# Patient Record
Sex: Male | Born: 1975 | Race: White | Hispanic: No | Marital: Married | Smoking: Never smoker
Health system: Southern US, Community
[De-identification: ages and names within clinical notes are randomized; demographics above are authoritative.]

## PROBLEM LIST (undated history)

## (undated) DIAGNOSIS — N200 Calculus of kidney: Secondary | ICD-10-CM

## (undated) DIAGNOSIS — E663 Overweight: Secondary | ICD-10-CM

## (undated) DIAGNOSIS — I10 Essential (primary) hypertension: Secondary | ICD-10-CM

## (undated) DIAGNOSIS — F419 Anxiety disorder, unspecified: Secondary | ICD-10-CM

## (undated) DIAGNOSIS — F411 Generalized anxiety disorder: Secondary | ICD-10-CM

## (undated) DIAGNOSIS — T7840XA Allergy, unspecified, initial encounter: Secondary | ICD-10-CM

## (undated) DIAGNOSIS — E785 Hyperlipidemia, unspecified: Secondary | ICD-10-CM

## (undated) DIAGNOSIS — M109 Gout, unspecified: Secondary | ICD-10-CM

## (undated) HISTORY — DX: Hyperlipidemia, unspecified: E78.5

## (undated) HISTORY — DX: Anxiety disorder, unspecified: F41.9

## (undated) HISTORY — PX: LITHOTRIPSY: SUR834

## (undated) HISTORY — DX: Overweight: E66.3

## (undated) HISTORY — DX: Generalized anxiety disorder: F41.1

## (undated) HISTORY — DX: Essential (primary) hypertension: I10

## (undated) HISTORY — PX: WRIST SURGERY: SHX841

## (undated) HISTORY — DX: Allergy, unspecified, initial encounter: T78.40XA

---

## 2001-09-19 ENCOUNTER — Emergency Department (HOSPITAL_COMMUNITY): Admission: EM | Admit: 2001-09-19 | Discharge: 2001-09-19 | Payer: Self-pay | Admitting: Emergency Medicine

## 2004-06-04 ENCOUNTER — Encounter: Admission: RE | Admit: 2004-06-04 | Discharge: 2004-06-04 | Payer: Self-pay | Admitting: Allergy and Immunology

## 2005-08-15 ENCOUNTER — Encounter: Admission: RE | Admit: 2005-08-15 | Discharge: 2005-08-15 | Payer: Self-pay | Admitting: Internal Medicine

## 2005-12-28 ENCOUNTER — Encounter: Admission: RE | Admit: 2005-12-28 | Discharge: 2005-12-28 | Payer: Self-pay | Admitting: Neurology

## 2006-01-27 ENCOUNTER — Ambulatory Visit (HOSPITAL_COMMUNITY): Admission: RE | Admit: 2006-01-27 | Discharge: 2006-01-27 | Payer: Self-pay | Admitting: Cardiology

## 2008-12-25 ENCOUNTER — Ambulatory Visit (HOSPITAL_COMMUNITY): Admission: RE | Admit: 2008-12-25 | Discharge: 2008-12-25 | Payer: Self-pay | Admitting: Urology

## 2009-02-25 ENCOUNTER — Ambulatory Visit: Payer: Self-pay | Admitting: Family Medicine

## 2009-02-25 DIAGNOSIS — J309 Allergic rhinitis, unspecified: Secondary | ICD-10-CM | POA: Insufficient documentation

## 2009-02-25 DIAGNOSIS — F411 Generalized anxiety disorder: Secondary | ICD-10-CM | POA: Insufficient documentation

## 2009-02-25 HISTORY — DX: Generalized anxiety disorder: F41.1

## 2009-02-25 LAB — CONVERTED CEMR LAB
ALT: 50 units/L (ref 0–53)
AST: 32 units/L (ref 0–37)
Albumin: 4.5 g/dL (ref 3.5–5.2)
Alkaline Phosphatase: 65 units/L (ref 39–117)
BUN: 12 mg/dL (ref 6–23)
Basophils Absolute: 0 10*3/uL (ref 0.0–0.1)
Basophils Relative: 0.3 % (ref 0.0–3.0)
Bilirubin, Direct: 0.2 mg/dL (ref 0.0–0.3)
CO2: 31 meq/L (ref 19–32)
Calcium: 9.6 mg/dL (ref 8.4–10.5)
Chloride: 108 meq/L (ref 96–112)
Cholesterol: 273 mg/dL — ABNORMAL HIGH (ref 0–200)
Creatinine, Ser: 1 mg/dL (ref 0.4–1.5)
Direct LDL: 190.2 mg/dL
Eosinophils Absolute: 0.2 10*3/uL (ref 0.0–0.7)
Eosinophils Relative: 3.6 % (ref 0.0–5.0)
GFR calc non Af Amer: 90.56 mL/min (ref >60)
Glucose, Bld: 84 mg/dL (ref 70–99)
HCT: 44.9 % (ref 39.0–52.0)
HDL: 30.5 mg/dL — ABNORMAL LOW (ref >39.00)
Hemoglobin: 15.9 g/dL (ref 13.0–17.0)
Lymphocytes Relative: 37.8 % (ref 12.0–46.0)
Lymphs Abs: 2.4 10*3/uL (ref 0.7–4.0)
MCHC: 35.4 g/dL (ref 30.0–36.0)
MCV: 88.1 fL (ref 78.0–100.0)
Monocytes Absolute: 0.4 10*3/uL (ref 0.1–1.0)
Monocytes Relative: 7 % (ref 3.0–12.0)
Neutro Abs: 3.4 10*3/uL (ref 1.4–7.7)
Neutrophils Relative %: 51.3 % (ref 43.0–77.0)
Platelets: 256 10*3/uL (ref 150.0–400.0)
Potassium: 3.6 meq/L (ref 3.5–5.1)
RBC: 5.1 M/uL (ref 4.22–5.81)
RDW: 12.2 % (ref 11.5–14.6)
Sodium: 143 meq/L (ref 135–145)
TSH: 1.12 microintl units/mL (ref 0.35–5.50)
Total Bilirubin: 1.8 mg/dL — ABNORMAL HIGH (ref 0.3–1.2)
Total CHOL/HDL Ratio: 9
Total Protein: 7.5 g/dL (ref 6.0–8.3)
Triglycerides: 295 mg/dL — ABNORMAL HIGH (ref 0.0–149.0)
VLDL: 59 mg/dL — ABNORMAL HIGH (ref 0.0–40.0)
WBC: 6.4 10*3/uL (ref 4.5–10.5)

## 2009-02-26 ENCOUNTER — Telehealth: Payer: Self-pay | Admitting: Family Medicine

## 2009-03-10 ENCOUNTER — Ambulatory Visit: Payer: Self-pay | Admitting: Family Medicine

## 2009-03-10 LAB — CONVERTED CEMR LAB
HDL: 33.8 mg/dL — ABNORMAL LOW (ref >39.00)
Triglycerides: 223 mg/dL — ABNORMAL HIGH (ref 0.0–149.0)
VLDL: 44.6 mg/dL — ABNORMAL HIGH (ref 0.0–40.0)

## 2009-03-20 ENCOUNTER — Ambulatory Visit: Payer: Self-pay | Admitting: Family Medicine

## 2009-03-20 DIAGNOSIS — E785 Hyperlipidemia, unspecified: Secondary | ICD-10-CM

## 2009-03-20 HISTORY — DX: Hyperlipidemia, unspecified: E78.5

## 2009-05-20 ENCOUNTER — Ambulatory Visit: Payer: Self-pay | Admitting: Family Medicine

## 2009-05-20 LAB — CONVERTED CEMR LAB
ALT: 70 units/L — ABNORMAL HIGH (ref 0–53)
AST: 43 units/L — ABNORMAL HIGH (ref 0–37)
Albumin: 4.4 g/dL (ref 3.5–5.2)
Alkaline Phosphatase: 89 units/L (ref 39–117)
Bilirubin, Direct: 0.2 mg/dL (ref 0.0–0.3)
Cholesterol: 171 mg/dL (ref 0–200)
Direct LDL: 104.6 mg/dL
HDL: 35 mg/dL — ABNORMAL LOW (ref >39.00)
Total Bilirubin: 1.8 mg/dL — ABNORMAL HIGH (ref 0.3–1.2)
Total CHOL/HDL Ratio: 5
Total Protein: 7.4 g/dL (ref 6.0–8.3)
Triglycerides: 232 mg/dL — ABNORMAL HIGH (ref 0.0–149.0)
VLDL: 46.4 mg/dL — ABNORMAL HIGH (ref 0.0–40.0)

## 2009-06-02 ENCOUNTER — Ambulatory Visit: Payer: Self-pay | Admitting: Family Medicine

## 2009-07-15 ENCOUNTER — Ambulatory Visit: Payer: Self-pay | Admitting: Family Medicine

## 2009-07-15 DIAGNOSIS — R109 Unspecified abdominal pain: Secondary | ICD-10-CM | POA: Insufficient documentation

## 2009-07-21 ENCOUNTER — Telehealth: Payer: Self-pay | Admitting: Family Medicine

## 2009-07-23 ENCOUNTER — Ambulatory Visit (HOSPITAL_BASED_OUTPATIENT_CLINIC_OR_DEPARTMENT_OTHER): Admission: RE | Admit: 2009-07-23 | Discharge: 2009-07-23 | Payer: Self-pay | Admitting: Orthopedic Surgery

## 2009-10-28 ENCOUNTER — Telehealth: Payer: Self-pay | Admitting: Family Medicine

## 2010-06-02 ENCOUNTER — Ambulatory Visit: Payer: Self-pay | Admitting: Family Medicine

## 2010-06-02 LAB — CONVERTED CEMR LAB
ALT: 69 units/L — ABNORMAL HIGH (ref 0–53)
AST: 43 units/L — ABNORMAL HIGH (ref 0–37)
Albumin: 4.6 g/dL (ref 3.5–5.2)
Alkaline Phosphatase: 84 units/L (ref 39–117)
BUN: 14 mg/dL (ref 6–23)
Basophils Absolute: 0.1 10*3/uL (ref 0.0–0.1)
Basophils Relative: 0.8 % (ref 0.0–3.0)
Bilirubin Urine: NEGATIVE
Bilirubin, Direct: 0.3 mg/dL (ref 0.0–0.3)
CO2: 29 meq/L (ref 19–32)
Calcium: 9.5 mg/dL (ref 8.4–10.5)
Chloride: 105 meq/L (ref 96–112)
Cholesterol: 157 mg/dL (ref 0–200)
Creatinine, Ser: 0.9 mg/dL (ref 0.4–1.5)
Eosinophils Absolute: 0.3 10*3/uL (ref 0.0–0.7)
Eosinophils Relative: 4.5 % (ref 0.0–5.0)
GFR calc non Af Amer: 98.98 mL/min (ref >60)
Glucose, Bld: 95 mg/dL (ref 70–99)
HCT: 44.2 % (ref 39.0–52.0)
HDL: 34.4 mg/dL — ABNORMAL LOW (ref >39.00)
Hemoglobin, Urine: NEGATIVE
Hemoglobin: 15.3 g/dL (ref 13.0–17.0)
Ketones, ur: NEGATIVE mg/dL
LDL Cholesterol: 86 mg/dL (ref 0–99)
Leukocytes, UA: NEGATIVE
Lymphocytes Relative: 43.1 % (ref 12.0–46.0)
Lymphs Abs: 3.1 10*3/uL (ref 0.7–4.0)
MCHC: 34.6 g/dL (ref 30.0–36.0)
MCV: 89.4 fL (ref 78.0–100.0)
Monocytes Absolute: 0.5 10*3/uL (ref 0.1–1.0)
Monocytes Relative: 7.3 % (ref 3.0–12.0)
Neutro Abs: 3.2 10*3/uL (ref 1.4–7.7)
Neutrophils Relative %: 44.3 % (ref 43.0–77.0)
Nitrite: NEGATIVE
Platelets: 254 10*3/uL (ref 150.0–400.0)
Potassium: 4.7 meq/L (ref 3.5–5.1)
RBC: 4.94 M/uL (ref 4.22–5.81)
RDW: 12.4 % (ref 11.5–14.6)
Sodium: 142 meq/L (ref 135–145)
Specific Gravity, Urine: 1.025 (ref 1.000–1.030)
TSH: 0.95 microintl units/mL (ref 0.35–5.50)
Total Bilirubin: 1.8 mg/dL — ABNORMAL HIGH (ref 0.3–1.2)
Total CHOL/HDL Ratio: 5
Total Protein, Urine: NEGATIVE mg/dL
Total Protein: 7.3 g/dL (ref 6.0–8.3)
Triglycerides: 183 mg/dL — ABNORMAL HIGH (ref 0.0–149.0)
Urine Glucose: NEGATIVE mg/dL
Urobilinogen, UA: 0.2 (ref 0.0–1.0)
VLDL: 36.6 mg/dL (ref 0.0–40.0)
WBC: 7.1 10*3/uL (ref 4.5–10.5)
pH: 6 (ref 5.0–8.0)

## 2010-06-09 ENCOUNTER — Ambulatory Visit: Payer: Self-pay | Admitting: Family Medicine

## 2010-09-09 ENCOUNTER — Ambulatory Visit: Payer: Self-pay | Admitting: Family Medicine

## 2010-09-09 LAB — CONVERTED CEMR LAB
ALT: 80 units/L — ABNORMAL HIGH (ref 0–53)
AST: 38 units/L — ABNORMAL HIGH (ref 0–37)
Albumin: 4.3 g/dL (ref 3.5–5.2)
Alkaline Phosphatase: 89 units/L (ref 39–117)
Bilirubin, Direct: 0.2 mg/dL (ref 0.0–0.3)
Cholesterol: 245 mg/dL — ABNORMAL HIGH (ref 0–200)
Direct LDL: 127.9 mg/dL
HDL: 40.4 mg/dL (ref >39.00)
Total Bilirubin: 1.6 mg/dL — ABNORMAL HIGH (ref 0.3–1.2)
Total CHOL/HDL Ratio: 6
Total Protein: 7.2 g/dL (ref 6.0–8.3)
Triglycerides: 371 mg/dL — ABNORMAL HIGH (ref 0.0–149.0)
VLDL: 74.2 mg/dL — ABNORMAL HIGH (ref 0.0–40.0)

## 2010-10-13 ENCOUNTER — Telehealth: Payer: Self-pay | Admitting: Family Medicine

## 2010-10-31 ENCOUNTER — Encounter: Payer: Self-pay | Admitting: Neurology
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
## 2010-11-09 NOTE — Progress Notes (Signed)
Summary: celexa refill  Phone Note Call from Patient   Summary of Call: patient would like to increase his celexa to 40mg  and will need a refill Initial call taken by: Kern Reap CMA Duncan Dull),  October 28, 2009 11:45 AM    New/Updated Medications: CELEXA 40 MG TABS (CITALOPRAM HYDROBROMIDE) take one tab at bedtime Prescriptions: CELEXA 40 MG TABS (CITALOPRAM HYDROBROMIDE) take one tab at bedtime  #100 x 3   Entered by:   Kern Reap CMA (AAMA)   Authorized by:   Roderick Pee MD   Signed by:   Kern Reap CMA (AAMA) on 10/28/2009   Method used:   Electronically to        [facility] [address] [phone]* (retail)       [address]       [place], [place]  [address]       Ph: [phone] or [phone]       Fax: [phone]   RxID:   [id number]

## 2010-11-11 NOTE — Progress Notes (Signed)
  Phone Note Call from Patient   Caller: Spouse Call For: Roderick Pee MD Summary of Call: Wife is  asking for a recommendation for a neurosurgeon.  His MRI showed ruputure disc, and spurs. [phone] Pt.  [phone] Initial call taken by: Lynann Beaver CMA AAMA,  October 13, 2010 9:07 AM  Follow-up for Phone Call        Dr. Barnett Abu, and his group Follow-up by: Roderick Pee MD,  October 14, 2010 7:51 AM  Additional Follow-up for Phone Call Additional follow up Details #1::        Notified pt. Additional Follow-up by: Lynann Beaver CMA AAMA,  October 14, 2010 11:15 AM

## 2010-11-22 ENCOUNTER — Ambulatory Visit (INDEPENDENT_AMBULATORY_CARE_PROVIDER_SITE_OTHER): Payer: Managed Care, Other (non HMO) | Admitting: Family Medicine

## 2010-11-22 ENCOUNTER — Encounter: Payer: Self-pay | Admitting: Family Medicine

## 2010-11-22 VITALS — BP 130/90 | Temp 98.3°F | Ht 69.5 in | Wt 233.0 lb

## 2010-11-22 DIAGNOSIS — J069 Acute upper respiratory infection, unspecified: Secondary | ICD-10-CM

## 2010-11-22 DIAGNOSIS — F411 Generalized anxiety disorder: Secondary | ICD-10-CM

## 2010-11-22 MED ORDER — CITALOPRAM HYDROBROMIDE 40 MG PO TABS: ORAL_TABLET | ORAL | Status: DC

## 2010-11-22 MED ORDER — HYDROCODONE-HOMATROPINE 5-1.5 MG/5ML PO SYRP: 5.0000 mL | ORAL_SOLUTION | Freq: Four times a day (QID) | ORAL | Status: AC | PRN

## 2010-11-22 MED ORDER — PREDNISONE 20 MG PO TABS: ORAL_TABLET | ORAL | Status: DC

## 2010-11-22 NOTE — Patient Instructions (Signed)
Drink lots of water.  Hydromet one half to 1 teaspoon at bedtime p.r.n. Cough.  Prednisone two tabs x 3 days, one x 3 days, a half x 3 days, then stop.  Return p.r.n.

## 2010-11-22 NOTE — Progress Notes (Signed)
  Subjective:    Patient ID: William Li, male    DOB: [birth year], 35 y.o.   MRN: [id number]  HPI CTim is a 35 year old, married male, nonsmoker, who comes in with a 3-day history of head congestion, sore throat, and cough.  No fever. He also states that he is taking 60 mg of Celexa daily, however, he still having issues with the panic attack and [place].  We discussed risks, options, the first thing will increase the Celexa to 80 if that does not work then I would recommend he see Liane Comber.  He  Review of Systems    He does have allergic rhinitis, but no history of any lung disease.  Again, a nonsmoker. Objective:   Physical Exam    Well-developed well-nourished, male in no acute distress.  Examination of the HEENT was negative.  Neck was supple.  No adenopathy.  Lungs are clear except for some very faint late expiratory wheezing    Assessment & Plan:  Viral syndrome with some mild secondary asthma.  Prednisone burst and taper.  Hydromet p.r.n.  Return p.r.n.

## 2010-11-26 ENCOUNTER — Telehealth: Payer: Self-pay | Admitting: Family Medicine

## 2010-11-26 NOTE — Telephone Encounter (Signed)
Triage vm-----pt stated that he saw Dr Tawanna Cooler on Monday and was given a cough medicine. He had to double up on it and now he is out. His cough has worsened and he is requesting a cough syrup that is stronger to help him sleep through the night. His pharmacy is [facility] on [address].

## 2010-11-26 NOTE — Telephone Encounter (Signed)
Advised to come in today for further evaluation.  However, he is out of town.  The patient he may want to try this Saturday clinic

## 2010-11-26 NOTE — Telephone Encounter (Signed)
An office visit was offered.  patient  Is out of town and can not come in for an office visit.  He states that the cough is not allowing him to sleep at night because the cough syrup is not working.  Any suggestions?

## 2010-11-30 ENCOUNTER — Ambulatory Visit (INDEPENDENT_AMBULATORY_CARE_PROVIDER_SITE_OTHER): Payer: Managed Care, Other (non HMO) | Admitting: Family Medicine

## 2010-11-30 ENCOUNTER — Encounter: Payer: Self-pay | Admitting: Family Medicine

## 2010-11-30 VITALS — BP 120/90 | Temp 98.1°F | Wt 230.0 lb

## 2010-11-30 DIAGNOSIS — J45901 Unspecified asthma with (acute) exacerbation: Secondary | ICD-10-CM

## 2010-11-30 MED ORDER — DOXYCYCLINE HYCLATE 100 MG PO TABS: 100.0000 mg | ORAL_TABLET | Freq: Two times a day (BID) | ORAL | Status: AC

## 2010-11-30 NOTE — Progress Notes (Signed)
  Subjective:    Patient ID: William Li, male    DOB: 03-01-1976, 35 y.o.   MRN: [id number]  HPI  William Li is a 35 year old, married male, nonsmoker, who comes in today for evaluation of head congestion, postnasal drip, and cough x 10 days.  We saw him a week ago with similar symptoms.  Examination at that time was negative except for some mild expiratory wheezing.  He was given a short course of prednisone, which he finished however, he states he still does not feel well.  Review of systems otherwise negative  Review of Systems neg    Objective:   Physical Exam    Well-developed well-nourished, male in no acute distress.  Examination HEENT negative.  Neck was supple.  Thyroid not enlarged.  No adenopathy.  Lungs are clear except for some very faint late expiratory wheezing    Assessment & Plan:  Viral syndrome with mild asthma.  Plan drink lots of water, prednisone, 20 mg a day for 3 days, 10 mg a day for 3 days, then 10 mg Monday, Wednesday, Friday, for a two-week taper.  Doxycycline 100 mg b.i.d. X 2 weeks.  Hydromet p.r.n. For cough

## 2010-11-30 NOTE — Patient Instructions (Signed)
Drink lots of water.  Prednisone one tablet x 3 days, a half x 3 days, then half a tablet every other day for a two-week taper.  Hydromet one half to 1 teaspoon at bedtime as needed for nighttime cough.  Doxycycline 100 mg b.i.d.

## 2010-12-09 ENCOUNTER — Telehealth: Payer: Self-pay | Admitting: *Deleted

## 2010-12-09 NOTE — Telephone Encounter (Signed)
If the medication, that we used has not helped that I would recommended that he see Dr. Nolen Mu

## 2010-12-09 NOTE — Telephone Encounter (Signed)
patient  Would like to try a different medication before seeing a new doctor.  Any suggestions?

## 2010-12-09 NOTE — Telephone Encounter (Signed)
Fleet Contras please call,,,,,, the milligrams a day, Celexa is the maximum dose of this medication, if it's not working.  I would recommend we get a consult from Dr. Rolly Pancake, Nolen Mu.  He can call and make his own appointment, but it will take 4 to 6 weeks because she is so good.  Therefore, in the meantime try Zoloft 50  mg nightly.........Marland Kitchenplease call and 50 tabs, one refill

## 2010-12-09 NOTE — Telephone Encounter (Signed)
Pt has increased up to the maximum dose of Celexa, and it is not working.   Would like another RX.

## 2010-12-10 NOTE — Telephone Encounter (Signed)
Left message on machine for patient

## 2011-02-25 NOTE — Op Note (Signed)
NAMEBRADDOCK, SERVELLON              ACCOUNT NO.:  [id number]   MEDICAL RECORD NO.:  [id number]          PATIENT TYPE:  OIB   LOCATION:  2899                         FACILITY:  [facility]   PHYSICIAN:  Armanda Magic, M.D.     DATE OF BIRTH:  1976-03-18   DATE OF PROCEDURE:  01/27/2006  DATE OF DISCHARGE:  01/27/2006                                 OPERATIVE REPORT   PROCEDURE PERFORMED:  Tilt table test.   REFERRING PHYSICIAN:  Meade Maw, M.D.   OPERATOR:  Armanda Magic, M.D.   INDICATIONS FOR PROCEDURE:  Syncope and presyncope.   COMPLICATIONS:  None.   The patient is a very pleasant 35 year old white male who has been having  some problems with syncope in the past which sounds vasovagal.  He would get  syncope when giving blood or watching any kind of invasive procedure.  Apparently he passed out when his wife had an amniocentesis as well as when  she had an epidural placed.  Recently, he has been having a lot of problems  with dizzy spells and now presents for tilt table test.   DESCRIPTION OF PROCEDURE:  The patient was brought to the cardiac  catheterization laboratory in a fasting nonsedated state.  Informed consent  was obtained.  The patient was connected to continuous heart rate and pulse  oximetry monitoring and intermittent blood pressure monitoring.  The  patient's blood pressure was measured supine for five minutes.  Then the  patient was tilted upright to 70 degrees for a total of 30 minutes.  The  patient was then placed supine and started on isuprel drip at 7.5 mL per  hour.  The drip was titrated up to a maximum of 15 mL per hour to obtain a  baseline heart rate increase of 20%.  The patient was then tilted upright to  70 degrees for a total of 13 minutes.  At the end of the procedure, the  patient was tilted back to supine position and later discharged to home.   Baseline blood pressure was 112/88 to 120/67 with heart rates in the 70s.  Lowest blood pressure  achieved during upright tilt as 113/74, heart rates  were in the 80s.  Lowest blood pressure achieved during upright tilt on  isuprel was 112/70 with heart rates in the 90s to low 100s.   ASSESSMENT:  1.  History of syncope and dizziness.  2.  Negative tilt table test for syncope or presyncope.  Normal blood      pressure and heart rate response to upright tilt and isuprel infusion.   PLAN:  Discharged to home and follow up with Dr. Fraser Din.      Armanda Magic, M.D.  Electronically Signed     TT/MEDQ  D:  01/27/2006  T:  01/30/2006  Job:  [id number]   cc:   Meade Maw, M.D.  Fax: [phone]

## 2011-06-09 ENCOUNTER — Ambulatory Visit (INDEPENDENT_AMBULATORY_CARE_PROVIDER_SITE_OTHER): Payer: Managed Care, Other (non HMO) | Admitting: Family Medicine

## 2011-06-09 ENCOUNTER — Encounter: Payer: Self-pay | Admitting: Family Medicine

## 2011-06-09 DIAGNOSIS — M999 Biomechanical lesion, unspecified: Secondary | ICD-10-CM

## 2011-06-09 DIAGNOSIS — E663 Overweight: Secondary | ICD-10-CM

## 2011-06-09 DIAGNOSIS — R030 Elevated blood-pressure reading, without diagnosis of hypertension: Secondary | ICD-10-CM

## 2011-06-09 DIAGNOSIS — M9981 Other biomechanical lesions of cervical region: Secondary | ICD-10-CM

## 2011-06-09 DIAGNOSIS — I10 Essential (primary) hypertension: Secondary | ICD-10-CM | POA: Insufficient documentation

## 2011-06-09 DIAGNOSIS — IMO0001 Reserved for inherently not codable concepts without codable children: Secondary | ICD-10-CM

## 2011-06-09 DIAGNOSIS — M542 Cervicalgia: Secondary | ICD-10-CM

## 2011-06-09 HISTORY — DX: Overweight: E66.3

## 2011-06-09 MED ORDER — VENLAFAXINE HCL ER 37.5 MG PO CP24: 75.0000 mg | ORAL_CAPSULE | Freq: Every day | ORAL | Status: DC

## 2011-06-09 NOTE — Assessment & Plan Note (Signed)
Elevated today, will be back in 3 weeks, if still elevated then would consider starting a medicine, no HCTZ due to pt stating ? Gout flare.   Will get labs at that time as well.

## 2011-06-09 NOTE — Patient Instructions (Addendum)
Nice to meet you We will try a new medicine called effexor.  Take 1 pill daily for the next week then 2 pills daily thereafter.  For you neck, try not to sleep with your arm up and do the exercises we talked about No more regular soda! I want to see you again in 3 weeks for your neck and to recheck your blood pressure.

## 2011-06-09 NOTE — Assessment & Plan Note (Signed)
Discussed food choices working out and attainable goals, pt will come back in three week goal of 230.

## 2011-06-09 NOTE — Assessment & Plan Note (Signed)
After verbal consent pt did have HVLA, with marked improvement.  Gave side effects to look out for and can take anti inflammatories in the acute time frame.  Given exercises and stretches will see again in 3 weeks.

## 2011-06-09 NOTE — Progress Notes (Signed)
  Subjective:    Patient ID: William Li, male    DOB: January 31, 1976, 35 y.o.   MRN: [id number]  HPI New pt here to establish care. Pt states was told about me and my manipulation for his neck 1.  Neck pain-  Pt has had it for years, used to lift a lot of weight, has not as much since he had his second daughter who is now 7 years of age.  States that he had a work up for it in the past because it was due to him having dizziness when he turns his head. Pt MRI was normal, still has trouble turning head and now does not drive because he becomes anxious. Pt states it hurts on both sides, no radiation of pain, no more of the dizziness, no trouble swallowing.  Denies weakness or tingling in extremities.  Pt x-ray does show some bone spurs on right C5-6 2.  Cholesterol-  Has been elevated in the past family hx of heart problems. Pt had it checked 9 months ago Lab Results  Component Value Date   CHOL 245* 09/09/2010   CHOL 157 06/02/2010   CHOL 171 05/20/2009   Lab Results  Component Value Date   HDL 40.40 09/09/2010   HDL 44.01* 06/02/2010   HDL 35.00* 05/20/2009   Lab Results  Component Value Date   LDLCALC 86 06/02/2010   Lab Results  Component Value Date   TRIG 371.0* 09/09/2010   TRIG 183.0* 06/02/2010   TRIG 232.0* 05/20/2009   Lab Results  Component Value Date   CHOLHDL 6 09/09/2010   CHOLHDL 5 06/02/2010   CHOLHDL 5 05/20/2009   Lab Results  Component Value Date   LDLDIRECT 127.9 09/09/2010   LDLDIRECT 104.6 05/20/2009   LDLDIRECT 144.6 03/10/2009   Only taking crestor three times a day due to elevated LFT's  3.  Obesity-  Pt has gained a lot of weight from improper diet and from not working out.   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 Pt states he is going to stop regular soda.  Review of Systems Negative except stated in HPI    Objective:   Physical Exam  Constitutional: He is oriented to person, place, and time. He appears  well-developed.       overweight white male  HENT:  Head: Normocephalic.  Right Ear: External ear normal.  Left Ear: External ear normal.  Eyes: Conjunctivae and EOM are normal. Pupils are equal, round, and reactive to light.  Neck: Normal range of motion. Thyromegaly present.  Cardiovascular: Normal rate, regular rhythm and normal heart sounds.   No murmur heard. Pulmonary/Chest: Effort normal and breath sounds normal.  Abdominal: Soft. Bowel sounds are normal. He exhibits no distension. There is no tenderness.  Musculoskeletal: He exhibits no edema.  Neurological: He is alert and oriented to person, place, and time. He has normal reflexes.  Skin: Skin is warm.  OMT Findings: Cervical: C3 rotated and side bent left  C6 rotated and side bent right   Thoracic: T5 rotated and side bent right Lumbar:neutral  Sacrum:neutral         Assessment & Plan:

## 2011-06-09 NOTE — Assessment & Plan Note (Signed)
After verbal consent pt did have HVLA, with marked improvement.  Gave side effects to look out for and can take anti inflammatories in the acute time frame.   

## 2011-06-14 ENCOUNTER — Other Ambulatory Visit: Payer: Self-pay | Admitting: Family Medicine

## 2011-06-21 ENCOUNTER — Ambulatory Visit (INDEPENDENT_AMBULATORY_CARE_PROVIDER_SITE_OTHER): Payer: Managed Care, Other (non HMO) | Admitting: Family Medicine

## 2011-06-21 ENCOUNTER — Encounter: Payer: Self-pay | Admitting: Family Medicine

## 2011-06-21 DIAGNOSIS — M542 Cervicalgia: Secondary | ICD-10-CM

## 2011-06-21 DIAGNOSIS — M67919 Unspecified disorder of synovium and tendon, unspecified shoulder: Secondary | ICD-10-CM

## 2011-06-21 DIAGNOSIS — E785 Hyperlipidemia, unspecified: Secondary | ICD-10-CM

## 2011-06-21 DIAGNOSIS — M999 Biomechanical lesion, unspecified: Secondary | ICD-10-CM

## 2011-06-21 DIAGNOSIS — R030 Elevated blood-pressure reading, without diagnosis of hypertension: Secondary | ICD-10-CM

## 2011-06-21 DIAGNOSIS — M7551 Bursitis of right shoulder: Secondary | ICD-10-CM

## 2011-06-21 DIAGNOSIS — M9981 Other biomechanical lesions of cervical region: Secondary | ICD-10-CM

## 2011-06-21 MED ORDER — DIAZEPAM 2 MG PO TABS: 2.0000 mg | ORAL_TABLET | Freq: Two times a day (BID) | ORAL | Status: AC | PRN

## 2011-06-21 NOTE — Assessment & Plan Note (Signed)
Patient has history of hypertriglyceridemia, and high cholesterol only taking Crestor 3 times a week due to the high liver enzymes. We will get labs as necessary.

## 2011-06-21 NOTE — Assessment & Plan Note (Signed)
After verbal consent pt did have HVLA, with marked improvement.  Gave side effects to look out for and can take anti inflammatories in the acute time frame.

## 2011-06-21 NOTE — Patient Instructions (Addendum)
Good to see you again. I have ordered labs on you. I would like you to come back in the morning when you are fasting to have been drawn.  I will tell you at next appointment I am giving you allium to try as a muscle relaxant. See you on the 20th .

## 2011-06-21 NOTE — Assessment & Plan Note (Signed)
After verbal consent pt did have HVLA, with marked improvement.  Gave side effects to look out for and can take anti inflammatories in the acute time frame.   

## 2011-06-21 NOTE — Progress Notes (Signed)
Subjective:    Patient ID: William Li, male    DOB: December 26, 1975, 35 y.o.   MRN: [id number]  HPI  New pt here to establish care. Pt states was told about me and my manipulation for his neck 1.  Neck pain-  she did have evaluation of neck last visit patient did have some manipulation and states that it did help for some time. Unfortunately patient though did a lot of overhead activity on labor day and the next day he started to have some pain. Patient has been doing the exercises and has been doing very well up to this injury. Patient states now that he is having no radiation of the pain from the neck down to her shoulder but also notices now that it actually hurts with certain movements that it did not hurt with before. She denies any loss in strength any numbness or tingling but wife has stated that he has started staining again with his head tilted to the left.  Patient did have an MRI done at [facility] we will get the results.  Pt x-ray does show some bone spurs on right C5-6 2.  Cholesterol-  Has been elevated in the past family hx of heart problems. Pt had it checked 9 months ago.    Only taking crestor three times a day due to elevated LFT's  3.  Obesity-  Pt has gained a lot of weight from improper diet and from not working out.   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 Pt states he is going to stop regular soda.   4.  anxiety patient was put on Effexor from last visit. He states since then has been feeling very good. Likes the dose that he is on denies any type of suicidal or homicidal ideation. Review of Systems  Negative except stated in HPI    Objective:   Physical Exam  Constitutional: He is oriented to person, place, and time. He appears well-developed.       overweight white male  HENT:  Head: Normocephalic.  Right Ear: External ear normal.  Left Ear: External ear normal.  Eyes: Conjunctivae and EOM are normal. Pupils  are equal, round, and reactive to light.  Neck: Normal range of motion. Thyromegaly present.  Cardiovascular: Normal rate, regular rhythm and normal heart sounds.   No murmur heard. Pulmonary/Chest: Effort normal and breath sounds normal.  Abdominal: Soft. Bowel sounds are normal. He exhibits no distension. There is no tenderness.  Musculoskeletal: He exhibits no edema.       Right shoulder: He exhibits tenderness and pain. He exhibits normal range of motion, no swelling, no effusion and no crepitus.       Pt has negative Neer.s + spurlings, + speeds test 5/5 strength full ROM active and passive   Neurological: He is alert and oriented to person, place, and time. He has normal reflexes.  Skin: Skin is warm.   OMT Findings: Cervical: C3 rotated and side bent left  C6 rotated and side bent right   Thoracic: T4, T7 rotated and side bent right Lumbar:neutral  Sacrum:left on left    Assessment & Plan:  HYPERLIPIDEMIA Patient has history of hypertriglyceridemia, and high cholesterol only taking Crestor 3 times a week due to the high liver enzymes. We will get labs as necessary.  Neck pain, musculoskeletal Patient next pain still seems to be associated with his bony spurs and some  neurologic compression. Patient though is still responding very well to manipulation therapy will continue. If this stops helping then we'll consider referring to Dr. Regino Schultze for possible injections.  Nonallopathic lesion of cervical region After verbal consent pt did have HVLA, with marked improvement.  Gave side effects to look out for and can take anti inflammatories in the acute time frame.    Nonallopathic lesion of thoracic region After verbal consent pt did have HVLA, with marked improvement.  Gave side effects to look out for and can take anti inflammatories in the acute time frame.    Bursitis of shoulder, right After obtaining consent, and per orders of Dr. Jennette Kettle, injection of 1 cc of Kenalog and 3 cc of  lidocaine given by [facility].  After being prepped by betadine and alcohol swab with 25 1 1/2 needle.  Patient instructed to remain in clinic for 20 minutes afterwards, and to report any adverse reaction to me immediately.

## 2011-06-21 NOTE — Assessment & Plan Note (Signed)
After obtaining consent, and per orders of Dr. Jennette Kettle, injection of 1 cc of Kenalog and 3 cc of lidocaine given by [facility].  After being prepped by betadine and alcohol swab with 25 1 1/2 needle.  Patient instructed to remain in clinic for 20 minutes afterwards, and to report any adverse reaction to me immediately.

## 2011-06-21 NOTE — Assessment & Plan Note (Signed)
Patient next pain still seems to be associated with his bony spurs and some neurologic compression. Patient though is still responding very well to manipulation therapy will continue. If this stops helping then we'll consider referring to Dr. Regino Schultze for possible injections.

## 2011-06-30 ENCOUNTER — Encounter: Payer: Self-pay | Admitting: Family Medicine

## 2011-06-30 ENCOUNTER — Ambulatory Visit (INDEPENDENT_AMBULATORY_CARE_PROVIDER_SITE_OTHER): Payer: Managed Care, Other (non HMO) | Admitting: Family Medicine

## 2011-06-30 DIAGNOSIS — M542 Cervicalgia: Secondary | ICD-10-CM

## 2011-06-30 DIAGNOSIS — M7551 Bursitis of right shoulder: Secondary | ICD-10-CM

## 2011-06-30 DIAGNOSIS — M67919 Unspecified disorder of synovium and tendon, unspecified shoulder: Secondary | ICD-10-CM

## 2011-06-30 MED ORDER — MELOXICAM 15 MG PO TABS: 15.0000 mg | ORAL_TABLET | Freq: Every day | ORAL | Status: DC

## 2011-06-30 MED ORDER — VENLAFAXINE HCL ER 150 MG PO CP24: 150.0000 mg | ORAL_CAPSULE | Freq: Every day | ORAL | Status: DC

## 2011-06-30 NOTE — Assessment & Plan Note (Signed)
Patient's shoulder can be impingement syndrome so at this time we'll hold off on any further imaging we will increase his Effexor 250 mg daily to see if this is anxiety induced situational pain. Will have patient start some exercises which patient was shown how to do today lab patient followup in 2 weeks to see if there is any improvement. Patient on physical exam his not having any true findings of impingement at this time which makes one think this could be anxiety provoked

## 2011-06-30 NOTE — Assessment & Plan Note (Signed)
Patient appears to be improving with his neck pain with some manipulation as well as possibly the Effexor. We'll continue the same management did not feel that patient is going to need Dr. Regino Schultze  on any other occasion at this time for injections.

## 2011-06-30 NOTE — Progress Notes (Signed)
  Subjective:    Patient ID: William Li, male    DOB: 03/16/1976, 35 y.o.   MRN: [id number]  HPI Patient's neck pain is much better at this time is doing very well feel like she's responded very well to the manipulation as well as the exercises. Patient's of right shoulder pain does not seem to be getting better at last visit patient did have a steroid injection and states that it seemed to help for about 72 hours and then the pain continued to come back patient still states the pain is deep and nerve pain that radiates from the shoulder down to approximately the elbow area patient states it is worse when he drives and when he is using the mouse on his computer. Denies any loss of strength or any loss of sensation or any swelling.  Patient has been taking Effexor for his anxiety at 75 mg is having no side effects and has not noticed that is helping with any other radiculopathy at this time. Patient though states that it has helped with some of his anxiety. Patient does state the value that night seems to help  Review of Systems     Objective:   Physical Exam  Physical Exam  Constitutional: He is oriented to person, place, and time. He appears well-developed.       overweight white male   Neck: Normal range of motion.  Cardiovascular: Normal rate, regular rhythm and normal heart sounds.  No murmur heard. Pulmonary/Chest: Effort normal and breath sounds normal.  Abdominal: Soft. Bowel sounds are normal. He exhibits no distension. There is no tenderness.  Musculoskeletal: He exhibits no edema.       Right shoulder: He exhibits tenderness and pain. He exhibits normal range of motion, no swelling, no effusion and no crepitus. Some trapizius and scalene spasm.       Pt has negative Neer.s  Negative Spurling,    Neurological: He is alert and oriented to person, place, and time. He has normal reflexes.  Skin: Skin is warm.   OMT Findings: Cervical: C3 rotated and side bent left  much  improved ROM  Thoracic: T4, T7 rotated and side bent right Lumbar:neutral  Sacrum:neutral    Assessment & Plan:

## 2011-07-27 ENCOUNTER — Other Ambulatory Visit: Payer: Self-pay | Admitting: Family Medicine

## 2011-07-27 ENCOUNTER — Encounter: Payer: Self-pay | Admitting: Family Medicine

## 2011-07-27 ENCOUNTER — Ambulatory Visit (INDEPENDENT_AMBULATORY_CARE_PROVIDER_SITE_OTHER): Payer: Managed Care, Other (non HMO) | Admitting: Family Medicine

## 2011-07-27 DIAGNOSIS — M9981 Other biomechanical lesions of cervical region: Secondary | ICD-10-CM

## 2011-07-27 DIAGNOSIS — R030 Elevated blood-pressure reading, without diagnosis of hypertension: Secondary | ICD-10-CM

## 2011-07-27 DIAGNOSIS — M999 Biomechanical lesion, unspecified: Secondary | ICD-10-CM

## 2011-07-27 MED ORDER — ROSUVASTATIN CALCIUM 10 MG PO TABS: 10.0000 mg | ORAL_TABLET | Freq: Every day | ORAL | Status: DC

## 2011-07-27 NOTE — Assessment & Plan Note (Signed)
Patient is improving patient is better standing posture as well with holding shoulders more in a neutral position patient come back in 4-6 weeks for another evaluation

## 2011-07-27 NOTE — Assessment & Plan Note (Signed)
Improved on this visit so we'll continue to monitor patient is to continue to try to lose weight and we will do a fasting lipid panel and basic metabolic panel

## 2011-07-27 NOTE — Progress Notes (Signed)
  Subjective:    Patient ID: William Li, male    DOB: 1976-03-31, 35 y.o.   MRN: [id number]  HPI Patient is coming back with a follow up of neck and shoulder pain on the right side. Patient states that is much improved he is doing the exercises as stated. Patient did have a minor setback when he started painting his house with a lot of overhead activity which he knew he should avoid but did not. Had a lot of stiffness but do the exercises as well as meloxicam it seemed to get much better. Patient complains of some mild pain in the anterior aspect of the shoulder from time to time usually worse at night after a long day but does not stop him from any activities daily living the patient denies any type of numbness or any swelling of the arm pain does not wake him up at night   Review of Systems As stated above   past medical surgical and social history reviewed without any changes Objective:   Physical Exam Constitutional: He is oriented to person, place, and time. He appears well-developed.       overweight white male   Neck: Normal range of motion. Mild restriction extension Cardiovascular: Normal rate, regular rhythm and normal heart sounds.  No murmur heard.  Right shoulder: He exhibits tenderness over the anterior aspect near the insertion short head of the biceps. He exhibits normal range of motion, no swelling, no effusion and no crepitus. Some trapizius and scalene spasm.       Pt has negative Neer.s  Negative Spurling,    Neurological: He is alert and oriented to person, place, and time. He has normal reflexes.  Skin: Skin is warm.   OMT Findings: Cervical: C3 rotated and side bent left  much improved ROM  Thoracic: T4, T7 rotated and side bent right T9 rotated and side bent left  Lumbar:neutral  Sacrum:neutral     Assessment & Plan:

## 2011-07-27 NOTE — Assessment & Plan Note (Signed)
After verbal consent pt did have HVLA, with marked improvement.  Gave side effects to look out for and can take anti inflammatories in the acute time frame.   

## 2011-07-27 NOTE — Patient Instructions (Signed)
Patient given verbal instruction

## 2011-07-29 ENCOUNTER — Ambulatory Visit (HOSPITAL_BASED_OUTPATIENT_CLINIC_OR_DEPARTMENT_OTHER)
Admission: RE | Admit: 2011-07-29 | Discharge: 2011-07-29 | Disposition: A | Discharge status: Home / Self Care | Payer: Managed Care, Other (non HMO) | Source: Ambulatory Visit | Attending: Urology | Admitting: Urology

## 2011-07-29 DIAGNOSIS — E669 Obesity, unspecified: Secondary | ICD-10-CM | POA: Insufficient documentation

## 2011-07-29 DIAGNOSIS — Z01812 Encounter for preprocedural laboratory examination: Secondary | ICD-10-CM | POA: Insufficient documentation

## 2011-07-29 DIAGNOSIS — Z302 Encounter for sterilization: Secondary | ICD-10-CM | POA: Insufficient documentation

## 2011-07-29 LAB — POCT HEMOGLOBIN-HEMACUE: Hemoglobin: 16 g/dL (ref 13.0–17.0)

## 2011-07-29 NOTE — Op Note (Signed)
  [name], [name]              ACCOUNT NO.:  [id number]  MEDICAL RECORD NO.:  [id number]  LOCATION:                               FACILITY:  [facility]  PHYSICIAN:  Oriel Ojo C. Vernie Ammons, M.D.  DATE OF BIRTH:  16-Feb-1976  DATE OF PROCEDURE:  07/29/2011 DATE OF DISCHARGE:                              OPERATIVE REPORT   PREOPERATIVE DIAGNOSIS:  Desires sterility.  POSTOPERATIVE DIAGNOSIS:  Desires sterility.  PROCEDURE:  Bilateral vasectomy.  SURGEON:  Claramae Rigdon C. Vernie Ammons, M.D.  ANESTHESIA:  General with local supplement.  SPECIMENS:  None.  BLOOD LOSS:  Minimal.  DRAINS:  None.  COMPLICATIONS:  None.  INDICATIONS:  The patient is a 35 year old married male with children who desires sterility.  On examination in my office, I found that his right vas was very difficult to palpate.  Because of this, I recommended this procedure be done in the operating room under anesthesia.  I have discussed the procedure with the patient in detail including its risks and complications and probability of success.  He understands and elected to proceed.  DESCRIPTION OF OPERATION:  After informed consent, the patient was brought to the major OR, placed on table, administered general anesthesia.  His genitalia was then sterilely prepped and draped.  An official time-out was then performed.  The piercing hemostat was used to make a small midline opening in the scrotum.  Through this, I placed the rounded vas clamp device and first grasped the left vas.  I brought it up out through the opening and cleared the perivasal tissue from the vas.  I then tied each end with a 2-0 silk suture proximally and distally, and then excised the segment. I then cauterized the lumen with the needle-tip of Bovie electrocautery and then cut the excess suture material and let the 2 ends returned into the scrotum.  The same procedure was performed on the right side.  I then injected 0.5% plain Marcaine in the incision and  it was dressed with Neosporin and 4x4s.  The patient was awakened and taken to recovery room in stable and satisfactory condition.  He tolerated the procedure well.  No intraoperative complications.  He will be given a prescription for 16 Vicodin HP and written discharge instructions.     [name] C. Vernie Ammons, M.D.     MCO/MEDQ  D:  07/29/2011  T:  07/29/2011  Job:  [id number]  Electronically Signed by Ihor Gully M.D. on 07/29/2011 06:05:17 PM

## 2011-09-06 ENCOUNTER — Ambulatory Visit (INDEPENDENT_AMBULATORY_CARE_PROVIDER_SITE_OTHER): Payer: Managed Care, Other (non HMO) | Admitting: Family Medicine

## 2011-09-06 ENCOUNTER — Encounter: Payer: Self-pay | Admitting: Family Medicine

## 2011-09-06 DIAGNOSIS — M109 Gout, unspecified: Secondary | ICD-10-CM

## 2011-09-06 DIAGNOSIS — IMO0001 Reserved for inherently not codable concepts without codable children: Secondary | ICD-10-CM

## 2011-09-06 DIAGNOSIS — M999 Biomechanical lesion, unspecified: Secondary | ICD-10-CM

## 2011-09-06 DIAGNOSIS — R030 Elevated blood-pressure reading, without diagnosis of hypertension: Secondary | ICD-10-CM

## 2011-09-06 NOTE — Patient Instructions (Signed)
Good to see you. I will check uric acid with your other labs I will try to call you with the results. Keep doing the exercises for you Think they're really helping. Have a great holiday.

## 2011-09-06 NOTE — Assessment & Plan Note (Signed)
After verbal consent pt did have HVLA, with marked improvement.  Gave side effects to look out for and can take anti inflammatories in the acute time frame.   

## 2011-09-06 NOTE — Progress Notes (Signed)
  Subjective:    Patient ID: William Li, male    DOB: [birth year], 35 y.o.   MRN: [id number]  HPI Review of Systems    Objective:   Physical Exam    Assessment & Plan:   Subjective:    Patient ID: William Li, male    DOB: Mar 14, 1976, 35 y.o.   MRN: [id number]  HPI Patient's neck pain is much better. Patient has been doing the exercises and has not had manipulation for about 6 weeks. Patient though is going down Christmas decorations and putting them up or she was doing a lot of overhead activity and started having some pain right sided his normal pain. PDenies any loss of strength or any loss of sensation or any swelling.  Patient has been taking Effexor for his anxiety at 150 mg is having no side effects and has not noticed that is helping with any other radiculopathy at this time. Patient though states that it has helped with some of his anxiety. Patient is watching his sleeping position as well which is helps with the pain. Patient has been very happy with his results so far.   Patient states that he does have done with a gout flare of his foot this is the fourth one in the last year. Patient has never had a uric acid level and only is given medications for when it flares. Review of Systems     Objective:   Physical Exam  Physical Exam  Constitutional: He is oriented to person, place, and time. He appears well-developed.       overweight white male   has lost weight since last visit Neck: Normal range of motion.  Cardiovascular: Normal rate, regular rhythm and normal heart sounds.  No murmur heard. Pulmonary/Chest: Effort normal and breath sounds normal.  Abdominal: Soft. Bowel sounds are normal. He exhibits no distension. There is no tenderness.  Musculoskeletal: He exhibits no edema.       Right shoulder: He exhibits tenderness and pain. He exhibits normal range of motion, no swelling, no effusion and no crepitus. Some trapizius and scalene spasm.       Pt has  negative Neer.s  Negative Spurling,    Neurological: He is alert and oriented to person, place, and time. He has normal reflexes.  Skin: Skin is warm.   OMT Findings: Cervical: Neutral  much improved ROM  Thoracic: T4, T7 extended rotated and side bent right Lumbar:neutral  Sacrum:neutral    Assessment & Plan:

## 2011-09-06 NOTE — Assessment & Plan Note (Signed)
Still we'll monitor closely no need for medication at patient continues to lose weight and hopefully we'll continue to improve

## 2011-09-06 NOTE — Assessment & Plan Note (Signed)
We'll check a uric acid level patient has a strong family history of gout likely will need allopurinol daily.

## 2011-12-02 ENCOUNTER — Ambulatory Visit: Payer: Managed Care, Other (non HMO) | Admitting: Family Medicine

## 2012-01-03 ENCOUNTER — Other Ambulatory Visit: Payer: Self-pay | Admitting: Family Medicine

## 2012-01-16 ENCOUNTER — Ambulatory Visit (INDEPENDENT_AMBULATORY_CARE_PROVIDER_SITE_OTHER): Payer: Self-pay | Admitting: Family Medicine

## 2012-01-16 ENCOUNTER — Other Ambulatory Visit: Payer: BC Managed Care – PPO

## 2012-01-16 ENCOUNTER — Encounter: Payer: Self-pay | Admitting: Family Medicine

## 2012-01-16 DIAGNOSIS — F411 Generalized anxiety disorder: Secondary | ICD-10-CM

## 2012-01-16 DIAGNOSIS — E785 Hyperlipidemia, unspecified: Secondary | ICD-10-CM

## 2012-01-16 DIAGNOSIS — M999 Biomechanical lesion, unspecified: Secondary | ICD-10-CM

## 2012-01-16 DIAGNOSIS — M9981 Other biomechanical lesions of cervical region: Secondary | ICD-10-CM

## 2012-01-16 DIAGNOSIS — M109 Gout, unspecified: Secondary | ICD-10-CM

## 2012-01-16 DIAGNOSIS — J309 Allergic rhinitis, unspecified: Secondary | ICD-10-CM

## 2012-01-16 LAB — COMPREHENSIVE METABOLIC PANEL
ALT: 77 U/L — ABNORMAL HIGH (ref 0–53)
AST: 48 U/L — ABNORMAL HIGH (ref 0–37)
Albumin: 4.5 g/dL (ref 3.5–5.2)
Alkaline Phosphatase: 70 U/L (ref 39–117)
BUN: 15 mg/dL (ref 6–23)
CO2: 26 mEq/L (ref 19–32)
Calcium: 9.7 mg/dL (ref 8.4–10.5)
Chloride: 105 mEq/L (ref 96–112)
Creat: 0.99 mg/dL (ref 0.50–1.35)
Glucose, Bld: 106 mg/dL — ABNORMAL HIGH (ref 70–99)
Potassium: 4.1 mEq/L (ref 3.5–5.3)
Sodium: 140 mEq/L (ref 135–145)
Total Bilirubin: 1.4 mg/dL — ABNORMAL HIGH (ref 0.3–1.2)
Total Protein: 7 g/dL (ref 6.0–8.3)

## 2012-01-16 MED ORDER — VENLAFAXINE HCL ER 75 MG PO CP24: 225.0000 mg | ORAL_CAPSULE | Freq: Every day | ORAL | Status: DC

## 2012-01-16 MED ORDER — FLUTICASONE PROPIONATE 50 MCG/ACT NA SUSP: 2.0000 | Freq: Every day | NASAL | Status: DC

## 2012-01-16 NOTE — Assessment & Plan Note (Signed)
We'll increase patient to 225 mg daily. Warned of potential increase in anxiety at first with patient being off medication for the last week. Patient declined wanting to see any therapist. Patient will followup in one month.

## 2012-01-16 NOTE — Progress Notes (Signed)
  Subjective:    Patient ID: William Li, male    DOB: 12-Jan-1976, 36 y.o.   MRN: [id number]  HPI Sinus trouble-patient has been having some sinus pain congestion but no fevers chills no shortness of breath chest pain. Patient gets this approximately 2 times a year has been try to take over-the-counter antihistamines but still having congestion. Patient has been using nasal irrigation at home 2 with minimal improvement. Patient was seen by urgent care approximately one month ago given Augmentin which did seem to help somewhat but no full recovery.  Neck pain-patient states he has been doing very well was working on his car yesterday and is having a little stiffness of his neck and would like to manipulation. Patient overall has been feeling much better has been taking Effexor which has helped the pain significantly still having some anxiety overall. Patient was been taken 150 mg of XR daily and overall is feeling better but having anxiety still with driving on highway. Patient denies any suicidal or homicidal ideation. Patient states only becomes anxious now and certain activities.   Review of Systems Denies fever, chills, nausea vomiting abdominal pain, dysuria, chest pain, shortness of breath dyspnea on exertion or numbness in extremities     Objective:   Physical Exam Physical Exam  Constitutional: He is oriented to person, place, and time. He appears well-developed.       overweight white male   has lost weight since last visit Neck: Normal range of motion.  Cardiovascular: Normal rate, regular rhythm and normal heart sounds.  No murmur heard. Pulmonary/Chest: Effort normal and breath sounds normal.  Abdominal: Soft. Bowel sounds are normal. He exhibits no distension. There is no tenderness.  Musculoskeletal: He exhibits no edema.       Pt has negative Neer.s  Negative Spurling,    Neurological: He is alert and oriented to person, place, and time. He has normal reflexes.  Skin: Skin  is warm.   OMT Findings: Cervical: C to rotated and side bent left, C5 rotated and side bent right  much improved ROM  Thoracic: T4extended rotated and side bent right Lumbar: L2 flexed rotated and side bent right  Sacrum:neutral    Assessment & Plan:

## 2012-01-16 NOTE — Assessment & Plan Note (Signed)
Do feel patient's allergies are likely secondary to allergic rhinitis and sinus infection. At this point will try to treat as seasonal allergies with Flonase. Continue over-the-counter Zyrtec continue nasal saline irrigation. Patient followup in one month if not better I would consider adding Singulair.

## 2012-01-16 NOTE — Patient Instructions (Signed)
Good to see you. We have increased her Effexor to 225 mg daily. I'm giving you a nose spray for your allergies. Use one spray each nostril daily at least for the next month or 2. I froze your wart on your foot. You may need to come back in 3-4 weeks breast to do it again. I will call you when I get your blood results.

## 2012-01-16 NOTE — Progress Notes (Signed)
Cmp,flp and uric acid done today Drayke Grabel

## 2012-01-16 NOTE — Assessment & Plan Note (Signed)
Decision today to treat with OMT was based on Physical Exam  After verbal consent patient was treated with particular and HVLA techniques in cervical and thoracic areas  Patient tolerated the procedure well with improvement in symptoms  Patient given exercises, stretches and lifestyle modifications  See medications in patient instructions if given  Patient will follow up in 4 weeks

## 2012-01-17 ENCOUNTER — Ambulatory Visit: Payer: Managed Care, Other (non HMO) | Admitting: Family Medicine

## 2012-01-17 LAB — URIC ACID: Uric Acid, Serum: 9.5 mg/dL — ABNORMAL HIGH (ref 4.0–7.8)

## 2012-01-18 ENCOUNTER — Other Ambulatory Visit: Payer: Self-pay | Admitting: Family Medicine

## 2012-01-18 ENCOUNTER — Telehealth: Payer: Self-pay | Admitting: Family Medicine

## 2012-01-18 MED ORDER — ALLOPURINOL 100 MG PO TABS: ORAL_TABLET | ORAL | Status: DC

## 2012-01-18 MED ORDER — COLCHICINE 0.6 MG PO TABS: 0.6000 mg | ORAL_TABLET | Freq: Every day | ORAL | Status: DC | PRN

## 2012-01-18 NOTE — Telephone Encounter (Signed)
Discussed with patient about lab results and high uric acid. We'll try to have patient decrease uric acid by starting allopurinol. Warned of potential side effects and when to seek medical attention. Have patient continue medication for 8 weeks and recheck uric acid levels.

## 2012-01-18 NOTE — Telephone Encounter (Signed)
Message copied by Judi Saa on Wed Jan 18, 2012 10:35 AM ------      Message from: Carney Living      Created: Tue Jan 17, 2012  2:00 PM                   ----- Message -----         From: [facility]         Sent: 01/17/2012  10:27 AM           To: Carney Living, MD

## 2012-02-23 ENCOUNTER — Other Ambulatory Visit: Payer: Self-pay | Admitting: Family Medicine

## 2012-02-23 MED ORDER — ROSUVASTATIN CALCIUM 10 MG PO TABS: 10.0000 mg | ORAL_TABLET | Freq: Every day | ORAL | Status: DC

## 2012-02-27 ENCOUNTER — Telehealth: Payer: Self-pay | Admitting: *Deleted

## 2012-02-27 NOTE — Telephone Encounter (Signed)
Dr. Katrinka Blazing actually did this PA over the phone.

## 2012-02-27 NOTE — Telephone Encounter (Signed)
Form filled out and returned to lynn.

## 2012-02-27 NOTE — Telephone Encounter (Signed)
PA required for Crestor.  Form placed in Dr. Needham Biggins's box. 

## 2012-02-27 NOTE — Telephone Encounter (Signed)
Approval received from insurance.  Pharmacy notified. 

## 2012-05-11 ENCOUNTER — Ambulatory Visit: Payer: BC Managed Care – PPO | Admitting: Family Medicine

## 2012-05-21 ENCOUNTER — Ambulatory Visit: Payer: BC Managed Care – PPO | Admitting: Family Medicine

## 2012-05-22 ENCOUNTER — Ambulatory Visit: Payer: BC Managed Care – PPO | Admitting: Family Medicine

## 2012-06-05 ENCOUNTER — Ambulatory Visit (INDEPENDENT_AMBULATORY_CARE_PROVIDER_SITE_OTHER): Payer: BC Managed Care – PPO | Admitting: Family Medicine

## 2012-06-05 VITALS — BP 146/92 | Ht 69.0 in | Wt 230.0 lb

## 2012-06-05 DIAGNOSIS — M9981 Other biomechanical lesions of cervical region: Secondary | ICD-10-CM

## 2012-06-05 DIAGNOSIS — F411 Generalized anxiety disorder: Secondary | ICD-10-CM

## 2012-06-05 DIAGNOSIS — M999 Biomechanical lesion, unspecified: Secondary | ICD-10-CM

## 2012-06-05 DIAGNOSIS — E785 Hyperlipidemia, unspecified: Secondary | ICD-10-CM

## 2012-06-05 MED ORDER — HYDROXYZINE HCL 25 MG PO TABS: 25.0000 mg | ORAL_TABLET | Freq: Three times a day (TID) | ORAL | Status: AC | PRN

## 2012-06-05 MED ORDER — PRAVASTATIN SODIUM 80 MG PO TABS: 80.0000 mg | ORAL_TABLET | Freq: Every day | ORAL | Status: DC

## 2012-06-05 NOTE — Assessment & Plan Note (Signed)
Seems to be only situational.  Patient has tied SSRI and had sexual dysfunctions, and can't afford SRNI Will try atarax and see if can tolerate side effects.  If he can then will try it while driving but told to try at home first.  If too sedating would allow new PCP to change medicine to potential b-blocker or would consider buspar.

## 2012-06-05 NOTE — Assessment & Plan Note (Signed)
refiled patient stating but changed to pravastatin Told to not take medicine if he needs his colchicine Will only take it 3 times a week due to elevated LFTS Patient will see pcp and probably should have labs drawn again CMET and lipids.

## 2012-06-05 NOTE — Assessment & Plan Note (Signed)
Decision today to treat with OMT was based on Physical Exam  After verbal consent patient was treated with HVLA techniques in cervical and thoracic areas  Patient tolerated the procedure well with improvement in symptoms  Patient given exercises, stretches and lifestyle modifications  See medications in patient instructions if given  Patient will follow up in 4 weeks

## 2012-06-05 NOTE — Progress Notes (Signed)
  Subjective:    Patient ID: William Li, male    DOB: Sep 05, 1976, 36 y.o.   MRN: [id number]  HPI  1.  Neck pain-  she did have evaluation of neck last visit patient did have.  Seen previously at family practice multiple times.  Patient did respond well to manipulation and would like it again.  Patient states it has started to tighten again and had trouble with turning his head which made it difficult to do ADL's.  No radiation of pain, no nunbness in extremities. No headache or vision changes  Pt x-ray does show some bone spurs on right C5-6   2.  Cholesterol-  Has been elevated in the past family hx of heart problems.  Lab Results  Component Value Date   CHOL 189 01/16/2012   HDL 37* 01/16/2012   LDLCALC 103* 01/16/2012   LDLDIRECT 127.9 09/09/2010   TRIG 247* 01/16/2012   CHOLHDL 5.1 01/16/2012     Only taking crestor three times a day due to elevated LFT's but cannot afford it on new insurance.     3.  anxiety patient was on effexor and overall was doing fine but did not notice much difference. Patient did wean himself off the medication and did have some withdrawal but overall doing ok.  Patient states his anxiety seems to be situational.Mostly only when he is driving being the time he has the problem.  No signs of depression and no signs of suicidal or homicidal ideation. . Review of Systems  Negative except stated in HPI    Objective:   Physical Exam  Constitutional: He is oriented to person, place, and time. He appears well-developed.       overweight white male  HENT:  Head: Normocephalic.  Right Ear: External ear normal.  Left Ear: External ear normal.  Eyes: Conjunctivae and EOM are normal. Pupils are equal, round, and reactive to light.  Neck: Normal range of motion. Thyromegaly present.  Cardiovascular: Normal rate, regular rhythm and normal heart sounds.   No murmur heard. Pulmonary/Chest: Effort normal and breath sounds normal.  Abdominal: Soft. Bowel sounds are  normal. He exhibits no distension. There is no tenderness.  Musculoskeletal: He exhibits no edema.       Right shoulder: He exhibits tenderness and pain. He exhibits normal range of motion, no swelling, no effusion and no crepitus.       Pt has negative Neer.s + spurlings, + speeds test 5/5 strength full ROM active and passive   Neurological: He is alert and oriented to person, place, and time. He has normal reflexes.  Skin: Skin is warm.   OMT Findings: Cervical: C3 rotated and side bent left  C6 rotated and side bent right   Thoracic: T4, T7 rotated and side bent right Lumbar:neutral  Sacrum:left on left    Assessment & Plan:  No problem-specific assessment & plan notes found for this encounter.

## 2012-06-12 ENCOUNTER — Encounter (HOSPITAL_COMMUNITY): Payer: Self-pay

## 2012-06-12 ENCOUNTER — Emergency Department (HOSPITAL_COMMUNITY)
Admission: EM | Admit: 2012-06-12 | Discharge: 2012-06-12 | Disposition: A | Discharge status: Home / Self Care | Payer: BC Managed Care – PPO | Attending: Emergency Medicine | Admitting: Emergency Medicine

## 2012-06-12 ENCOUNTER — Emergency Department (HOSPITAL_COMMUNITY): Payer: BC Managed Care – PPO

## 2012-06-12 DIAGNOSIS — F411 Generalized anxiety disorder: Secondary | ICD-10-CM | POA: Insufficient documentation

## 2012-06-12 DIAGNOSIS — M109 Gout, unspecified: Secondary | ICD-10-CM | POA: Insufficient documentation

## 2012-06-12 DIAGNOSIS — R0789 Other chest pain: Secondary | ICD-10-CM | POA: Insufficient documentation

## 2012-06-12 DIAGNOSIS — Z7982 Long term (current) use of aspirin: Secondary | ICD-10-CM | POA: Insufficient documentation

## 2012-06-12 HISTORY — DX: Gout, unspecified: M10.9

## 2012-06-12 HISTORY — DX: Calculus of kidney: N20.0

## 2012-06-12 LAB — CBC
Hemoglobin: 14.8 g/dL (ref 13.0–17.0)
MCHC: 35.2 g/dL (ref 30.0–36.0)
RBC: 4.94 MIL/uL (ref 4.22–5.81)
WBC: 12.8 10*3/uL — ABNORMAL HIGH (ref 4.0–10.5)

## 2012-06-12 LAB — BASIC METABOLIC PANEL
Chloride: 103 mEq/L (ref 96–112)
GFR calc Af Amer: >90 mL/min (ref >90)
GFR calc non Af Amer: >90 mL/min (ref >90)
Glucose, Bld: 115 mg/dL — ABNORMAL HIGH (ref 70–99)
Potassium: 4.1 mEq/L (ref 3.5–5.1)
Sodium: 140 mEq/L (ref 135–145)

## 2012-06-12 LAB — TROPONIN I: Troponin I: <0.3 ng/mL (ref <0.30)

## 2012-06-12 LAB — D-DIMER, QUANTITATIVE: D-Dimer, Quant: <0.22 ug/mL-FEU (ref 0.00–0.48)

## 2012-06-12 MED ORDER — CYCLOBENZAPRINE HCL 10 MG PO TABS: 10.0000 mg | ORAL_TABLET | Freq: Two times a day (BID) | ORAL | Status: AC | PRN

## 2012-06-12 MED ORDER — TRAMADOL HCL 50 MG PO TABS: 50.0000 mg | ORAL_TABLET | Freq: Four times a day (QID) | ORAL | Status: AC | PRN

## 2012-06-12 MED ORDER — OXYCODONE-ACETAMINOPHEN 5-325 MG PO TABS: 1.0000 | ORAL_TABLET | Freq: Four times a day (QID) | ORAL | Status: AC | PRN
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
## 2012-06-12 NOTE — ED Notes (Signed)
Patient transported to X-ray 

## 2012-06-12 NOTE — ED Notes (Signed)
Family at bedside. 

## 2012-06-12 NOTE — ED Provider Notes (Signed)
Medical screening examination/treatment/procedure(s) were conducted as a shared visit with non-physician practitioner(s) and myself.  I personally evaluated the patient during the encounter  Six hours of constant left sided chest pain, worse with deep breathing.  Recent car travel.  D-dimer negative, CXR negative.  Atypical for ACS.  Glynn Octave, MD 06/12/12 [phone]

## 2012-06-12 NOTE — ED Notes (Signed)
PA at bedside.

## 2012-06-12 NOTE — ED Notes (Signed)
Pt presents to ED with chest pain this am. Pt states also having sob and on expiration pain gets worse. Pt was given 324 asa by nurse at work. EMS attempted to start an IV and HR dropped and blood pressure went down.

## 2012-06-20 ENCOUNTER — Ambulatory Visit (INDEPENDENT_AMBULATORY_CARE_PROVIDER_SITE_OTHER): Payer: BC Managed Care – PPO | Admitting: Family Medicine

## 2012-06-20 ENCOUNTER — Encounter: Payer: Self-pay | Admitting: Family Medicine

## 2012-06-20 VITALS — BP 142/88 | HR 93 | Temp 98.0°F | Ht 69.0 in | Wt 231.0 lb

## 2012-06-20 DIAGNOSIS — I10 Essential (primary) hypertension: Secondary | ICD-10-CM

## 2012-06-20 DIAGNOSIS — F411 Generalized anxiety disorder: Secondary | ICD-10-CM

## 2012-06-20 DIAGNOSIS — E785 Hyperlipidemia, unspecified: Secondary | ICD-10-CM

## 2012-06-20 MED ORDER — SERTRALINE HCL 50 MG PO TABS: 50.0000 mg | ORAL_TABLET | Freq: Every day | ORAL | Status: DC

## 2012-06-20 MED ORDER — ROSUVASTATIN CALCIUM 10 MG PO TABS: 10.0000 mg | ORAL_TABLET | Freq: Every day | ORAL | Status: DC

## 2012-06-20 MED ORDER — HYDROCHLOROTHIAZIDE 25 MG PO TABS: 25.0000 mg | ORAL_TABLET | Freq: Every day | ORAL | Status: DC

## 2012-06-20 NOTE — Patient Instructions (Signed)
It was nice to meet you.  For your anxiety and OCD, I want you to try Zoloft, start at 50 mg (one tablet) daily, then increase to 100 mg (two tablets daily.   For your blood pressure, I am starting you on HCTZ, please take it once a day.  Please make an appointment to see me in about one month to check on your anxiety and your blood pressure.   I have switched you back to crestor for your cholesterol.

## 2012-06-20 NOTE — Assessment & Plan Note (Signed)
Will change back to Crestor per patient request to see if body aches improve.

## 2012-06-20 NOTE — Assessment & Plan Note (Signed)
Will start zoloft at 50 mg daily, then increase to 100, as he feels OCD is a part of his anxiety and phobia, this may be a helpful medication.  Discussed with him that benzo's are not safe as his specific anxiety is about driving- he is agreeable with this plan.

## 2012-06-20 NOTE — Assessment & Plan Note (Signed)
Multiple elevated readings in office and now one in ER- with significant FH of cardiac disease, will start HCTZ today.  F/U in one month
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
## 2012-07-03 ENCOUNTER — Ambulatory Visit: Payer: BC Managed Care – PPO | Admitting: Family Medicine

## 2012-07-18 ENCOUNTER — Ambulatory Visit (INDEPENDENT_AMBULATORY_CARE_PROVIDER_SITE_OTHER): Payer: BC Managed Care – PPO | Admitting: Family Medicine

## 2012-07-18 ENCOUNTER — Encounter: Payer: Self-pay | Admitting: Family Medicine

## 2012-07-18 VITALS — BP 128/86 | HR 80 | Temp 98.1°F | Ht 69.0 in | Wt 231.0 lb

## 2012-07-18 DIAGNOSIS — I10 Essential (primary) hypertension: Secondary | ICD-10-CM

## 2012-07-18 DIAGNOSIS — F411 Generalized anxiety disorder: Secondary | ICD-10-CM

## 2012-07-18 DIAGNOSIS — M109 Gout, unspecified: Secondary | ICD-10-CM

## 2012-07-18 MED ORDER — BUSPIRONE HCL 15 MG PO TABS: 15.0000 mg | ORAL_TABLET | Freq: Two times a day (BID) | ORAL | Status: DC | PRN

## 2012-07-18 MED ORDER — ALLOPURINOL 100 MG PO TABS: 200.0000 mg | ORAL_TABLET | Freq: Every day | ORAL | Status: DC

## 2012-07-18 MED ORDER — SERTRALINE HCL 50 MG PO TABS: 150.0000 mg | ORAL_TABLET | Freq: Every day | ORAL | Status: DC

## 2012-07-18 NOTE — Assessment & Plan Note (Signed)
Will refill Allopurinol since seems to be preventing gout attacks.

## 2012-07-18 NOTE — Assessment & Plan Note (Signed)
Improved control with HCTZ, will continue current treatment.

## 2012-07-18 NOTE — Assessment & Plan Note (Signed)
Will increase zoloft to 150 mg daily, also will add buspar as needed for panic attacks. F/U in 3 months.
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
## 2012-07-18 NOTE — Patient Instructions (Signed)
It was good to see you.  Your blood pressure is 128/86, please keep taking the Blood pressure medication as prescribed.   For your anxiety, please increase the zoloft to 150 mg daily.  Also, you can try Bupsar as needed for anxiety.  If in 3 weeks you do not feel the zoloft is controlling your anxiety well, you can start taking the buspar twice a day every day.

## 2012-10-22 ENCOUNTER — Telehealth: Payer: Self-pay | Admitting: *Deleted

## 2012-10-22 NOTE — Telephone Encounter (Signed)
PA required for Sertraline 50 mg.  Form given to MD.

## 2012-10-22 NOTE — Telephone Encounter (Signed)
Form filled out and placed in fax box

## 2012-10-23 NOTE — Telephone Encounter (Signed)
Patient is calling to check the status of his refill on Sertraline.  He has been out for a couple of days now.

## 2012-10-23 NOTE — Telephone Encounter (Signed)
Larita Fife,  Have you heard back about this.  Im not familiar with the whole process of PA. [place], Maryjo Rochester

## 2012-10-23 NOTE — Telephone Encounter (Signed)
Forward to [employer]

## 2012-10-23 NOTE — Telephone Encounter (Signed)
Form faxed to insurance company.  Dr. Lula Olszewski had written on fax from pharmacy "Sertraline 100 mg one daily. #90 and 3 refills" and signed . I called this to pharmacy.  I called patient to advise him of this and he states he had been taking 150 mg daily. States he does not take Buspar. He did not like the way it made him feel.  Will send message to Dr. Jasper Riling to please clarify correct dosage.   Appointment scheduled for 01/21 for follow up.

## 2012-10-24 MED ORDER — SERTRALINE HCL 50 MG PO TABS: 150.0000 mg | ORAL_TABLET | Freq: Every day | ORAL | Status: DC

## 2012-10-24 NOTE — Telephone Encounter (Signed)
New Refill for Sertraline 150 mg PO daily sent to pharmacy, please notify patient.

## 2012-10-24 NOTE — Telephone Encounter (Signed)
Unable to reach pt by both home and cell William Li, [name]

## 2012-10-30 ENCOUNTER — Ambulatory Visit (INDEPENDENT_AMBULATORY_CARE_PROVIDER_SITE_OTHER): Payer: BC Managed Care – PPO | Admitting: Family Medicine

## 2012-10-30 ENCOUNTER — Encounter: Payer: Self-pay | Admitting: Family Medicine

## 2012-10-30 VITALS — BP 118/82 | HR 84 | Temp 98.4°F | Ht 69.0 in | Wt 233.0 lb

## 2012-10-30 DIAGNOSIS — I10 Essential (primary) hypertension: Secondary | ICD-10-CM

## 2012-10-30 DIAGNOSIS — M542 Cervicalgia: Secondary | ICD-10-CM

## 2012-10-30 DIAGNOSIS — F411 Generalized anxiety disorder: Secondary | ICD-10-CM

## 2012-10-30 MED ORDER — CYCLOBENZAPRINE HCL 10 MG PO TABS: 10.0000 mg | ORAL_TABLET | Freq: Three times a day (TID) | ORAL | Status: DC | PRN

## 2012-10-30 NOTE — Assessment & Plan Note (Signed)
Patient with specific fear of driving, especially on highways.  Improved control on larger dose of zoloft, will continue at 150 mg daily.  The patient's main goal is to be able to drive alone on a highway, so I do not feel benzodiazepine treatment would be safe.  He has never tried therapy for his anxiety, and says he will think about pursing it.  At this time, I gave him Dr. Carola Rhine card and told him if he is interested he can call and discuss treatment options with her.  F/U in 3 months.
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
## 2012-10-30 NOTE — Assessment & Plan Note (Signed)
Recurrent, Rx for flexeril and discussed neck stretches, heating pad or ice.

## 2012-10-30 NOTE — Assessment & Plan Note (Signed)
Well controlled on HCTZ, no changes.

## 2012-10-30 NOTE — Patient Instructions (Signed)
Your blood pressure today was BP: 118/82 mmHg.  Remember your goal blood pressure is about 130/80.  Please be sure to take your medication every day.  Great Job!  For your neck, you can try flexeril, a muscle relaxer.  This may make you sleepy so do not drive after taking it until you know how it will affect you.   Continue the zoloft at 150 mg daily.  If you are interested, please call Dr. Pascal Lux to discuss therapy options.    Please come back and see me for a check up in about 3 months.

## 2012-11-16 ENCOUNTER — Other Ambulatory Visit: Payer: Self-pay | Admitting: Family Medicine

## 2012-12-31 ENCOUNTER — Other Ambulatory Visit: Payer: Self-pay | Admitting: *Deleted

## 2012-12-31 MED ORDER — FLUTICASONE PROPIONATE 50 MCG/ACT NA SUSP: 2.0000 | Freq: Every day | NASAL | Status: DC

## 2013-01-12 ENCOUNTER — Other Ambulatory Visit: Payer: Self-pay | Admitting: Family Medicine

## 2013-01-14 NOTE — Telephone Encounter (Signed)
Called patient. Left VM. Refilled allopurinol. Did not refill HCTZ, as he would likely benefit from a medication change. Requested patient call for PCP f/u for HTN.  Last two BP noted to be great.

## 2013-01-18 ENCOUNTER — Telehealth: Payer: Self-pay | Admitting: Family Medicine

## 2013-01-18 MED ORDER — HYDROCHLOROTHIAZIDE 25 MG PO TABS: 25.0000 mg | ORAL_TABLET | Freq: Every day | ORAL | Status: DC

## 2013-01-18 NOTE — Telephone Encounter (Signed)
Rx sent in, please notify him.

## 2013-01-18 NOTE — Telephone Encounter (Signed)
Patient will be leaving to go out of town for a week.  While Dr. Lula Olszewski was gone, Dr. Armen Pickup was covering and decided that the patient might benefit from a change in his med regime even after he had seen her in January so she did not refill his HCTZ which he has taken for 9 months and has worked well for him.  This is a urgent matter for a refill at least enough to get him through next week.  From his appt notes in January his f/u appt is to be in April and he is scheduled for 4/21.

## 2013-01-28 ENCOUNTER — Ambulatory Visit (INDEPENDENT_AMBULATORY_CARE_PROVIDER_SITE_OTHER): Payer: BC Managed Care – PPO | Admitting: Family Medicine

## 2013-01-28 ENCOUNTER — Encounter: Payer: Self-pay | Admitting: Family Medicine

## 2013-01-28 VITALS — BP 122/82 | HR 80 | Wt 228.0 lb

## 2013-01-28 DIAGNOSIS — I10 Essential (primary) hypertension: Secondary | ICD-10-CM

## 2013-01-28 DIAGNOSIS — E785 Hyperlipidemia, unspecified: Secondary | ICD-10-CM

## 2013-01-28 DIAGNOSIS — M109 Gout, unspecified: Secondary | ICD-10-CM

## 2013-01-28 DIAGNOSIS — F411 Generalized anxiety disorder: Secondary | ICD-10-CM

## 2013-01-28 MED ORDER — HYDROCHLOROTHIAZIDE 25 MG PO TABS: 25.0000 mg | ORAL_TABLET | Freq: Every day | ORAL | Status: DC

## 2013-01-28 MED ORDER — SERTRALINE HCL 50 MG PO TABS: 150.0000 mg | ORAL_TABLET | Freq: Every day | ORAL | Status: DC

## 2013-01-28 MED ORDER — FLUTICASONE PROPIONATE 50 MCG/ACT NA SUSP: 2.0000 | Freq: Every day | NASAL | Status: AC

## 2013-01-28 MED ORDER — ROSUVASTATIN CALCIUM 10 MG PO TABS: 10.0000 mg | ORAL_TABLET | Freq: Every day | ORAL | Status: DC

## 2013-01-28 MED ORDER — ALLOPURINOL 100 MG PO TABS: 100.0000 mg | ORAL_TABLET | Freq: Every day | ORAL | Status: DC

## 2013-01-28 NOTE — Patient Instructions (Addendum)
It was good to see you.  Please make an appointment to have your blood work done- I will send you a letter with your lab results, or call you if anything is abnormal.    Your blood pressure today was BP: 122/82 mmHg.  Remember your goal blood pressure is about 130/80.  Please be sure to take your medication every day.    Please decrease your allopurinol to 1 pill (100 mg) once a day  Please continue your other medications at the current dose.

## 2013-01-28 NOTE — Assessment & Plan Note (Signed)
Will check lipid profile, continue crestor.

## 2013-01-28 NOTE — Assessment & Plan Note (Signed)
Will decrease allopurinol to 100 mg PO daily and see if he remains asymptomatic. Advised to call if he feels gout flair has started.

## 2013-01-28 NOTE — Assessment & Plan Note (Signed)
Well controlled, continue HCTZ at current dose.

## 2013-01-28 NOTE — Assessment & Plan Note (Signed)
Improved on zoloft 150 mg- continue current dose.
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
## 2013-01-29 ENCOUNTER — Other Ambulatory Visit: Payer: BC Managed Care – PPO

## 2013-01-29 DIAGNOSIS — I10 Essential (primary) hypertension: Secondary | ICD-10-CM

## 2013-01-29 DIAGNOSIS — E785 Hyperlipidemia, unspecified: Secondary | ICD-10-CM

## 2013-01-29 LAB — LIPID PANEL
HDL: 32 mg/dL — ABNORMAL LOW (ref >39)
Triglycerides: 391 mg/dL — ABNORMAL HIGH (ref <150)

## 2013-01-29 LAB — BASIC METABOLIC PANEL
Calcium: 9.8 mg/dL (ref 8.4–10.5)
Chloride: 103 mEq/L (ref 96–112)
Creat: 0.97 mg/dL (ref 0.50–1.35)

## 2013-01-29 NOTE — Progress Notes (Signed)
BMP AND FLP DONE TODAY Kla Bily 

## 2013-01-30 ENCOUNTER — Telehealth: Payer: Self-pay | Admitting: Family Medicine

## 2013-01-30 ENCOUNTER — Encounter: Payer: Self-pay | Admitting: Family Medicine

## 2013-01-30 MED ORDER — ROSUVASTATIN CALCIUM 20 MG PO TABS: 20.0000 mg | ORAL_TABLET | Freq: Every day | ORAL | Status: AC

## 2013-01-30 NOTE — Telephone Encounter (Signed)
Called, left voicemail, LDL, total cholesterol, and triglycerides elevated.  I am increasing Crestor to 20 mg daily, and recommend he re-start taking the fish oil daily.  Will send in new Rx for Crestor, as well as send letter to patient with lab results.

## 2013-07-17 ENCOUNTER — Other Ambulatory Visit: Payer: Self-pay | Admitting: Family Medicine

## 2013-09-11 ENCOUNTER — Other Ambulatory Visit: Payer: Self-pay | Admitting: Family Medicine

## 2014-01-04 ENCOUNTER — Other Ambulatory Visit: Payer: Self-pay | Admitting: Family Medicine

## 2014-01-11 ENCOUNTER — Other Ambulatory Visit: Payer: Self-pay | Admitting: Family Medicine

## 2014-09-30 ENCOUNTER — Encounter: Payer: Self-pay | Admitting: Podiatry

## 2014-09-30 ENCOUNTER — Ambulatory Visit (INDEPENDENT_AMBULATORY_CARE_PROVIDER_SITE_OTHER): Payer: 59

## 2014-09-30 ENCOUNTER — Ambulatory Visit (INDEPENDENT_AMBULATORY_CARE_PROVIDER_SITE_OTHER): Payer: 59 | Admitting: Podiatry

## 2014-09-30 VITALS — BP 132/82 | HR 89 | Resp 16 | Ht 69.0 in | Wt 230.0 lb

## 2014-09-30 DIAGNOSIS — Q6650 Congenital pes planus, unspecified foot: Secondary | ICD-10-CM

## 2014-09-30 DIAGNOSIS — M1A0790 Idiopathic chronic gout, unspecified ankle and foot, without tophus (tophi): Secondary | ICD-10-CM

## 2014-09-30 DIAGNOSIS — M205X9 Other deformities of toe(s) (acquired), unspecified foot: Secondary | ICD-10-CM

## 2014-09-30 NOTE — Progress Notes (Signed)
   Subjective:    Patient ID: William Li, male    DOB: 08/30/1976, 38 y.o.   MRN: [id number]  HPI Comments: Pain and swelling on both great toes, places on the side of the toes.they can be real painful ,  i have flat feet.    Toe Pain       Review of Systems  HENT:       Sinus problems  Allergic/Immunologic: Positive for environmental allergies.  All other systems reviewed and are negative.      Objective:   Physical Exam: I have reviewed his past medical history medications allergy surgery social history and review of systems. Ulcers are strongly palpable. Neurologic sensorium is intact for Semmes-Weinstein monofilament. Deep tendon reflexes are intact bilateral. Muscle strength is 5 over 5 dorsiflexion plantar flexors and inverters everters on physical musculature is intact. He is wearing an ankle brace to the right ankle secondary to a fractured tibia and medial malleolus in March. He is complaining of painful calluses and IP joints of the hallux bilateral. Radiographic evaluation does demonstrate a history of pes planus with an elevated first metatarsal associated with midfoot collapse. He also has osteoarthritis and probable gouty arthritis to the medial aspect of the IP joint hallux bilaterally right greater than left. Lateral view does demonstrate extensus of the hallux bilateral. Dorsal spurring is noted. 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 There is no gouty tophi noted.        Assessment & Plan:  Assessment: Pes planus with elevated first metatarsals. 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  Plan: Discussed surgical intervention with him consisting of a [employer]gs with osteotomy and an IPJ fusion of the hallux bilateral just to assist in eliminating his painful calluses. Otherwise flatfoot reconstruction would be necessary. We also discussed the need for  pair of orthotics. He was scanned today prior to departure.

## 2014-10-16 ENCOUNTER — Ambulatory Visit: Payer: Self-pay | Admitting: Podiatry

## 2014-10-17 ENCOUNTER — Ambulatory Visit: Payer: 59 | Admitting: *Deleted

## 2014-10-17 DIAGNOSIS — Q6650 Congenital pes planus, unspecified foot: Secondary | ICD-10-CM

## 2014-10-17 NOTE — Progress Notes (Signed)
PATIENT PRESENTS FOR ORTHOTIC PICK UP  

## 2014-10-17 NOTE — Patient Instructions (Signed)

## 2014-12-25 ENCOUNTER — Ambulatory Visit (INDEPENDENT_AMBULATORY_CARE_PROVIDER_SITE_OTHER): Payer: 59 | Admitting: Podiatry

## 2014-12-25 ENCOUNTER — Encounter: Payer: Self-pay | Admitting: Podiatry

## 2014-12-25 VITALS — BP 128/75 | HR 86 | Resp 16

## 2014-12-25 DIAGNOSIS — M722 Plantar fascial fibromatosis: Secondary | ICD-10-CM

## 2014-12-25 MED ORDER — MELOXICAM 15 MG PO TABS: 15.0000 mg | ORAL_TABLET | Freq: Every day | ORAL | Status: DC

## 2014-12-25 NOTE — Progress Notes (Signed)
He presents today complaining of painful feet after having worn his orthotics for a short period of time. He states that his left heel in particular is painful and tingly after he is been sitting for a while or lying in bed and gets up to walk. He states the pain is excruciating he states he can hardly walk with the orthotics without the orthotics with or without issues.  Objective: Vital signs are stable alert and oriented 3. Orthotics appear to be made correctly when evaluated he has pain on palpation medial calcaneal tubercle of the left heel. Minimally so on the right. Pulses remain palpable no calf pain.  Assessment: Plantar fasciitis with pes planus left.  Plan: Continue the use of the orthotics and injected his left heel today. Dispensed prescription for nonsteroidal anti-inflammatory. Follow up with me in 1 month

## 2015-01-09 ENCOUNTER — Other Ambulatory Visit: Payer: Self-pay | Admitting: Family Medicine

## 2015-01-22 ENCOUNTER — Ambulatory Visit: Payer: 59 | Admitting: Podiatry

## 2019-05-10 ENCOUNTER — Encounter (HOSPITAL_COMMUNITY): Payer: Self-pay

## 2019-05-10 ENCOUNTER — Other Ambulatory Visit: Payer: Self-pay

## 2019-05-10 ENCOUNTER — Emergency Department (HOSPITAL_COMMUNITY)
Admission: EM | Admit: 2019-05-10 | Discharge: 2019-05-10 | Disposition: A | Discharge status: Home / Self Care | Payer: Managed Care, Other (non HMO) | Attending: Emergency Medicine | Admitting: Emergency Medicine

## 2019-05-10 DIAGNOSIS — Z23 Encounter for immunization: Secondary | ICD-10-CM | POA: Diagnosis not present

## 2019-05-10 DIAGNOSIS — W268XXA Contact with other sharp object(s), not elsewhere classified, initial encounter: Secondary | ICD-10-CM | POA: Insufficient documentation

## 2019-05-10 DIAGNOSIS — Y9289 Other specified places as the place of occurrence of the external cause: Secondary | ICD-10-CM | POA: Diagnosis not present

## 2019-05-10 DIAGNOSIS — Y999 Unspecified external cause status: Secondary | ICD-10-CM | POA: Diagnosis not present

## 2019-05-10 DIAGNOSIS — Z7982 Long term (current) use of aspirin: Secondary | ICD-10-CM | POA: Insufficient documentation

## 2019-05-10 DIAGNOSIS — Z79899 Other long term (current) drug therapy: Secondary | ICD-10-CM | POA: Diagnosis not present

## 2019-05-10 DIAGNOSIS — S61217A Laceration without foreign body of left little finger without damage to nail, initial encounter: Secondary | ICD-10-CM | POA: Insufficient documentation

## 2019-05-10 DIAGNOSIS — I10 Essential (primary) hypertension: Secondary | ICD-10-CM | POA: Insufficient documentation

## 2019-05-10 DIAGNOSIS — Y9389 Activity, other specified: Secondary | ICD-10-CM | POA: Insufficient documentation

## 2019-05-10 MED ORDER — IBUPROFEN 200 MG PO TABS
600.0000 mg | ORAL_TABLET | Freq: Once | ORAL | Status: AC
  Administered 2019-05-10: 23:00:00 600 mg via ORAL
  Filled 2019-05-10: qty 3

## 2019-05-10 MED ORDER — LIDOCAINE HCL (PF) 1 % IJ SOLN
5.0000 mL | Freq: Once | INTRAMUSCULAR | Status: AC
  Administered 2019-05-10: 23:00:00 5 mL
  Filled 2019-05-10: qty 30

## 2019-05-10 MED ORDER — TETANUS-DIPHTH-ACELL PERTUSSIS 5-2.5-18.5 LF-MCG/0.5 IM SUSP
0.5000 mL | Freq: Once | INTRAMUSCULAR | Status: AC
  Administered 2019-05-10: 0.5 mL via INTRAMUSCULAR
  Filled 2019-05-10: qty 0.5
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
## 2019-05-10 NOTE — ED Triage Notes (Signed)
Pt sustained laceration to left hand pinky by means of a hand saw. Pt denies pain at this time. Bleeding controlled, area cleansed and wrapped with sterile gauze.

## 2019-05-10 NOTE — Discharge Instructions (Addendum)
Please read instructions below.  Keep your wound clean and covered. In 24 hours, you can get your wound wet; gently clean it with soap and water, pat it dry, and reapply a clean bandage. You can take ibuprofen/advil as needed for pain Follow up with your primary care or urgent care for wound recheck in 7 days.  Return to the ER for fever, pus draining from wound, redness, or new or worsening symptoms.  

## 2019-05-23 ENCOUNTER — Other Ambulatory Visit: Payer: Self-pay | Admitting: Family Medicine

## 2019-05-23 DIAGNOSIS — N6452 Nipple discharge: Secondary | ICD-10-CM

## 2019-06-11 ENCOUNTER — Ambulatory Visit
Admission: RE | Admit: 2019-06-11 | Discharge: 2019-06-11 | Disposition: A | Discharge status: Home / Self Care | Payer: Managed Care, Other (non HMO) | Source: Ambulatory Visit | Attending: Family Medicine | Admitting: Family Medicine

## 2019-06-11 ENCOUNTER — Other Ambulatory Visit: Payer: Self-pay

## 2019-06-11 DIAGNOSIS — N6452 Nipple discharge: Secondary | ICD-10-CM

## 2020-01-08 ENCOUNTER — Other Ambulatory Visit: Payer: Self-pay | Admitting: Family Medicine

## 2020-01-08 DIAGNOSIS — R7989 Other specified abnormal findings of blood chemistry: Secondary | ICD-10-CM

## 2020-01-13 ENCOUNTER — Ambulatory Visit
Admission: RE | Admit: 2020-01-13 | Discharge: 2020-01-13 | Disposition: A | Discharge status: Home / Self Care | Payer: Managed Care, Other (non HMO) | Source: Ambulatory Visit | Attending: Family Medicine | Admitting: Family Medicine

## 2020-01-13 DIAGNOSIS — R7989 Other specified abnormal findings of blood chemistry: Secondary | ICD-10-CM

## 2021-08-20 ENCOUNTER — Other Ambulatory Visit (HOSPITAL_COMMUNITY): Payer: Self-pay | Admitting: Family Medicine

## 2021-09-28 ENCOUNTER — Ambulatory Visit (HOSPITAL_COMMUNITY)
Admission: RE | Admit: 2021-09-28 | Discharge: 2021-09-28 | Disposition: A | Discharge status: Home / Self Care | Payer: Self-pay | Source: Ambulatory Visit | Attending: Family Medicine | Admitting: Family Medicine

## 2021-09-28 ENCOUNTER — Other Ambulatory Visit: Payer: Self-pay

## 2021-09-28 DIAGNOSIS — Z136 Encounter for screening for cardiovascular disorders: Secondary | ICD-10-CM | POA: Insufficient documentation

## 2021-09-28 DIAGNOSIS — Z8249 Family history of ischemic heart disease and other diseases of the circulatory system: Secondary | ICD-10-CM | POA: Insufficient documentation
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
## 2021-11-29 ENCOUNTER — Encounter: Payer: Self-pay | Admitting: Cardiology

## 2021-11-29 ENCOUNTER — Other Ambulatory Visit: Payer: Self-pay

## 2021-11-29 ENCOUNTER — Ambulatory Visit: Payer: 59 | Admitting: Cardiology

## 2021-11-29 VITALS — BP 136/90 | HR 61 | Ht 69.0 in | Wt 231.2 lb

## 2021-11-29 DIAGNOSIS — R931 Abnormal findings on diagnostic imaging of heart and coronary circulation: Secondary | ICD-10-CM | POA: Diagnosis not present

## 2021-11-29 DIAGNOSIS — E782 Mixed hyperlipidemia: Secondary | ICD-10-CM

## 2021-11-29 DIAGNOSIS — I10 Essential (primary) hypertension: Secondary | ICD-10-CM | POA: Diagnosis not present

## 2023-02-15 ENCOUNTER — Other Ambulatory Visit: Payer: Self-pay

## 2023-02-15 ENCOUNTER — Emergency Department (HOSPITAL_BASED_OUTPATIENT_CLINIC_OR_DEPARTMENT_OTHER)
Admission: EM | Admit: 2023-02-15 | Discharge: 2023-02-15 | Disposition: A | Discharge status: Home / Self Care | Payer: 59 | Attending: Emergency Medicine | Admitting: Emergency Medicine

## 2023-02-15 ENCOUNTER — Encounter (HOSPITAL_BASED_OUTPATIENT_CLINIC_OR_DEPARTMENT_OTHER): Payer: Self-pay

## 2023-02-15 ENCOUNTER — Emergency Department (HOSPITAL_BASED_OUTPATIENT_CLINIC_OR_DEPARTMENT_OTHER): Payer: 59 | Admitting: Radiology

## 2023-02-15 DIAGNOSIS — Z7982 Long term (current) use of aspirin: Secondary | ICD-10-CM | POA: Diagnosis not present

## 2023-02-15 DIAGNOSIS — I10 Essential (primary) hypertension: Secondary | ICD-10-CM | POA: Diagnosis not present

## 2023-02-15 DIAGNOSIS — R079 Chest pain, unspecified: Secondary | ICD-10-CM | POA: Diagnosis present

## 2023-02-15 DIAGNOSIS — R7989 Other specified abnormal findings of blood chemistry: Secondary | ICD-10-CM | POA: Insufficient documentation

## 2023-02-15 DIAGNOSIS — Z79899 Other long term (current) drug therapy: Secondary | ICD-10-CM | POA: Diagnosis not present

## 2023-02-15 LAB — BASIC METABOLIC PANEL
Anion gap: 11 (ref 5–15)
BUN: 15 mg/dL (ref 6–20)
CO2: 26 mmol/L (ref 22–32)
Calcium: 9.8 mg/dL (ref 8.9–10.3)
Chloride: 103 mmol/L (ref 98–111)
Creatinine, Ser: 1.01 mg/dL (ref 0.61–1.24)
GFR, Estimated: >60 mL/min (ref >60)
Glucose, Bld: 109 mg/dL — ABNORMAL HIGH (ref 70–99)
Potassium: 3.8 mmol/L (ref 3.5–5.1)
Sodium: 140 mmol/L (ref 135–145)

## 2023-02-15 LAB — TROPONIN I (HIGH SENSITIVITY)
Troponin I (High Sensitivity): 4 ng/L (ref <18)
Troponin I (High Sensitivity): 3 ng/L (ref <18)

## 2023-02-15 LAB — CBC
HCT: 42 % (ref 39.0–52.0)
Hemoglobin: 14.7 g/dL (ref 13.0–17.0)
MCH: 30.1 pg (ref 26.0–34.0)
MCHC: 35 g/dL (ref 30.0–36.0)
MCV: 86.1 fL (ref 80.0–100.0)
Platelets: 265 10*3/uL (ref 150–400)
RBC: 4.88 MIL/uL (ref 4.22–5.81)
RDW: 12.7 % (ref 11.5–15.5)
WBC: 8.2 10*3/uL (ref 4.0–10.5)
nRBC: 0 % (ref 0.0–0.2)

## 2023-02-15 MED ORDER — LORAZEPAM 1 MG PO TABS
0.5000 mg | ORAL_TABLET | Freq: Once | ORAL | Status: AC
  Administered 2023-02-15: 0.5 mg via ORAL
  Filled 2023-02-15: qty 1

## 2023-02-15 NOTE — ED Triage Notes (Signed)
Patient here POV from Home.  Endorses "Weird" feeling that began today 2 Hours ago after eating. Endorses Anxiety and Chest tightness.   No SOB. No Fevers. No N/V. 170/100 BP assessed.  NAD Noted during triage. A&Ox4. GCS 15. Ambulatory.

## 2023-02-15 NOTE — Discharge Instructions (Signed)
Return to the ER with any new symptoms such as chest pain, shortness of breath, headache, blurred vision in the setting of hypertension Please follow-up with your PCP for management of your blood pressure Please begin recording her blood pressure in the morning and at night.  Please record your measurements utilizing the form provided Please read the attached guide concerning managing hypertension

## 2023-02-15 NOTE — ED Provider Notes (Signed)
[facility] AT [facility] Provider Note   CSN: [id number] Arrival date & time: 02/15/23  2053     History  Chief Complaint  Patient presents with   Chest Pain    William Li is a 47 y.o. male with medical history of kidney stone, hypertension.  Patient presents to ED for evaluation of hypertension.  Patient states that tonight he became very anxious, thinking about changes at his job.  Patient states that he started to have some chest tightness at this time so we checked his blood pressure and it was 190 systolic.  Patient reports that he does take 10 mg of amlodipine for blood pressure and he has taken this for over 10 years.  Patient reports he did take his medications today.  He came in for evaluation secondary to his chest tightness and high blood pressure.  He denies shortness of breath, chest pain, headache or blurred vision.  He denies abdominal pain, nausea, vomiting, diarrhea, leg swelling, lightheadedness, dizziness, weakness.   Chest Pain Associated symptoms: no fever, no headache, no nausea, no shortness of breath and no vomiting        Home Medications Prior to Admission medications   Medication Sig Start Date End Date Taking? Authorizing Provider  allopurinol (ZYLOPRIM) 100 MG tablet TAKE 1 TABLET BY MOUTH EVERY DAY    Hunter, Aldine Contes, MD  amLODipine (NORVASC) 5 MG tablet Take 1 tablet by mouth daily.    [provider]  aspirin EC 81 MG tablet Take 81 mg by mouth daily.    [provider]  cetirizine (ZYRTEC) 10 MG tablet Take 10 mg by mouth daily as needed. For allergies    [provider]  cetirizine (ZYRTEC) 10 MG tablet Take 10 mg by mouth daily.    [provider]  fluticasone (FLONASE) 50 MCG/ACT nasal spray Place 2 sprays into the nose daily. 01/28/13 01/28/14  Ardyth Gal, MD  Multiple Vitamins-Minerals [facility] FOR HIM) TABS 1 tablet    [provider]  Omega-3 Fatty Acids  (FISH OIL) 1000 MG CAPS Take 1 capsule by mouth daily.    [provider]  propranolol (INDERAL) 20 MG tablet Take 20 mg by mouth 2 (two) times daily as needed. 11/12/21   [provider]  rosuvastatin (CRESTOR) 20 MG tablet Take 1 tablet (20 mg total) by mouth at bedtime. Monday Wednesday Friday only 01/30/13   Ardyth Gal, MD  sertraline (ZOLOFT) 50 MG tablet TAKE 3 TABLETS BY MOUTH DAILY    Shelva Majestic, MD  sertraline (ZOLOFT) 50 MG tablet Take 3 tablets by mouth daily.    [provider]      Allergies    Clarithromycin and Moxifloxacin    Review of Systems   Review of Systems  Constitutional:  Negative for fever.  Eyes:  Negative for visual disturbance.  Respiratory:  Positive for chest tightness. Negative for shortness of breath.   Cardiovascular:  Negative for chest pain.  Gastrointestinal:  Negative for nausea and vomiting.  Neurological:  Negative for headaches.    Physical Exam Updated Vital Signs BP (!) 175/110 (BP Location: Right Arm)   Pulse 89   Temp 98.8 F (37.1 C) (Oral)   Resp 18   Ht 5\' 9"  (1.753 m)   Wt 104.3 kg   SpO2 100%   BMI 33.97 kg/m  Physical Exam Vitals and nursing note reviewed.  Constitutional:      General: He is not in acute  distress.    Appearance: Normal appearance. He is not ill-appearing, toxic-appearing or diaphoretic.  HENT:     Head: Normocephalic and atraumatic.     Nose: Nose normal.     Mouth/Throat:     Mouth: Mucous membranes are moist.     Pharynx: Oropharynx is clear.  Eyes:     Extraocular Movements: Extraocular movements intact.     Conjunctiva/sclera: Conjunctivae normal.     Pupils: Pupils are equal, round, and reactive to light.  Cardiovascular:     Rate and Rhythm: Normal rate and regular rhythm.  Pulmonary:     Effort: Pulmonary effort is normal.     Breath sounds: Normal breath sounds. No wheezing.  Abdominal:     General: Abdomen is flat. Bowel sounds are normal.      Palpations: Abdomen is soft.     Tenderness: There is no abdominal tenderness.  Musculoskeletal:     Cervical back: Normal range of motion and neck supple. No tenderness.     Right lower leg: No edema.     Left lower leg: No edema.  Skin:    General: Skin is warm and dry.     Capillary Refill: Capillary refill takes less than 2 seconds.  Neurological:     Mental Status: He is alert and oriented to person, place, and time.     ED Results / Procedures / Treatments   Labs (all labs ordered are listed, but only abnormal results are displayed) Labs Reviewed  BASIC METABOLIC PANEL - Abnormal; Notable for the following components:      Result Value   Glucose, Bld 109 (*)    All other components within normal limits  CBC  TROPONIN I (HIGH SENSITIVITY)  TROPONIN I (HIGH SENSITIVITY)    EKG EKG Interpretation  Date/Time:  Wednesday Feb 15 2023 21:05:15 EDT Ventricular Rate:  88 PR Interval:  186 QRS Duration: 84 QT Interval:  354 QTC Calculation: 428 R Axis:   28 Text Interpretation: Normal sinus rhythm Normal ECG No significant change since last tracing Confirmed by Alvira Monday ([id number]) on 02/15/2023 10:43:50 PM  Radiology DG Chest 2 View  Result Date: 02/15/2023 CLINICAL DATA:  Chest tightness, anxiety. EXAM: CHEST - 2 VIEW COMPARISON:  06/12/2012. FINDINGS: The heart size and mediastinal contours are within normal limits. Both lungs are clear. Degenerative changes are present in the thoracic spine. No acute osseous abnormality. IMPRESSION: No active cardiopulmonary disease. Electronically Signed   By: Thornell Sartorius M.D.   On: 02/15/2023 21:58    Procedures Procedures   Medications Ordered in ED Medications  LORazepam (ATIVAN) tablet 0.5 mg (0.5 mg Oral Given 02/15/23 2252)    ED Course/ Medical Decision Making/ A&P    Medical Decision Making Amount and/or Complexity of Data Reviewed Labs: ordered. Radiology: ordered.  Risk Prescription drug  management.   47 year old male presents to the ED for evaluation.  Please see HPI for further details.  On examination the patient is afebrile and nontachycardic.  Lung sounds are clear bilaterally, he is not hypoxic.  Her abdomen is soft and compressible throughout.  Neurological examination shows no focal neurodeficits.  CBC without leukocytosis or anemia.  BMP without electrolyte derangement, elevated creatinine.  Troponin 4, delta 3.  Chest x-ray shows no consolidations or effusions.  EKG is nonischemic.  Patient given 0.5 mg of Ativan.  Patient states he is very anxious secondary to changes at his job.  Patient reports he does not like change.  Patient states he believes  his elevated blood pressure is secondary to this.  Patient denies chest pain, shortness of breath, headache or blurred vision.  The patient has asymptomatic hypertension at this time.  After administration of Ativan, patient reports he feels much better.  His blood pressure has reduced slightly to 156/90.  Patient denies chest pain, shortness of breath, headache or vision.  At this time the patient be discharged home and advised to follow-up with his PCP.  The patient was advised that if he develops chest pain, shortness of breath, headache or blurred vision in the setting of hypertension he will need to return to the ER.  He voiced understanding.  He had all of his questions answered to his satisfaction.  Patient stable to discharge home.   Final Clinical Impression(s) / ED Diagnoses Final diagnoses:  Asymptomatic hypertension    Rx / DC Orders ED Discharge Orders     None         Al Decant, PA-C 02/15/23 2352    Alvira Monday, MD 02/16/23 1630

## 2023-03-30 IMAGING — CT CT CARDIAC CORONARY ARTERY CALCIUM SCORE
3 series · 14 of 20 positions shown, 15 images · non-contrast
Comparison: 06/12/2012 chest radiograph
COMPARISON: 06/12/2012 chest radiograph

Addendum:
EXAM:
OVER-READ INTERPRETATION  CT CHEST

The following report is an over-read performed by radiologist Dr.
over-read does not include interpretation of cardiac or coronary
anatomy or pathology. The calcium score interpretation by the
cardiologist is attached.
CLINICAL DATA: Risk stratification: 45 Year-old White Male
Coronary Calcium Score
TECHNIQUE: The patient was scanned on a Siemens Force scanner. Axial
non-contrast 3 mm slices were carried out through the heart. The
data set was analyzed on a dedicated work station and scored using
the Agatson method.

[Series 3: ax ca scr 70% (id) · axial · 0.39mm/px · z∈[+952,+1026]mm · 6 of 53 slices shown]
[im 8/53  vessel]
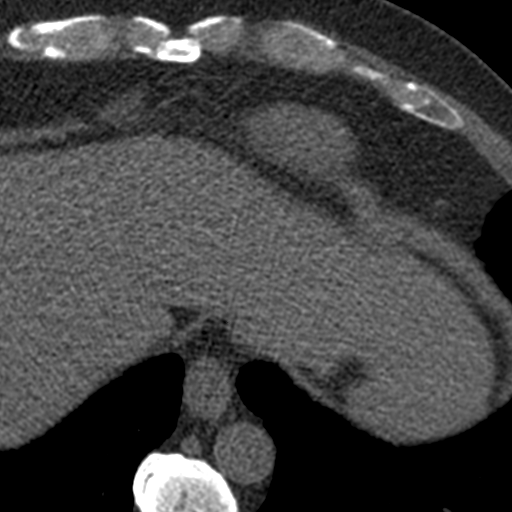
[im 15/53  vessel]
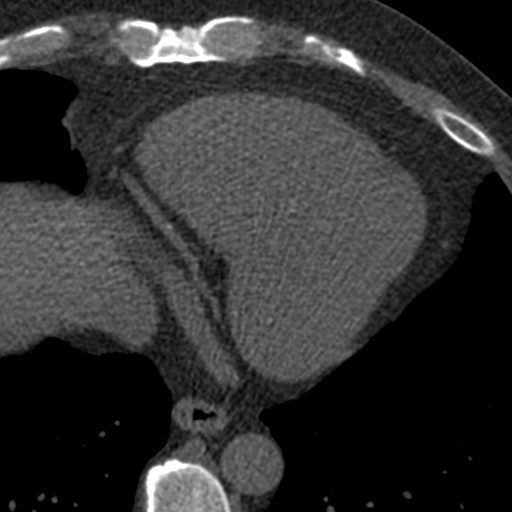
[im 23/53  vessel]
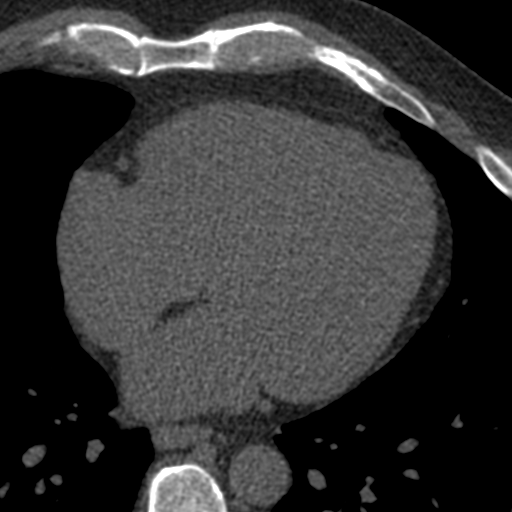
[im 30/53  vessel]
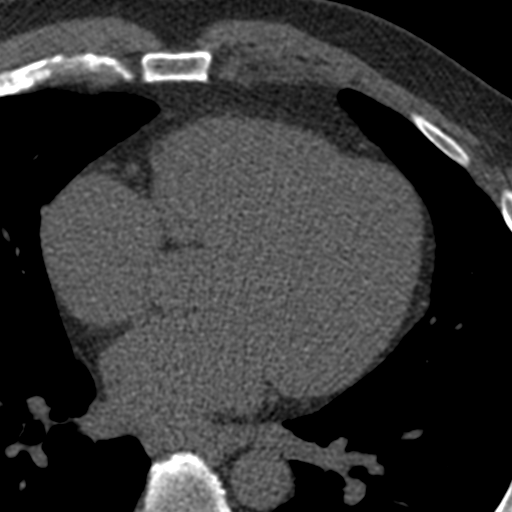
[im 38/53  vessel]
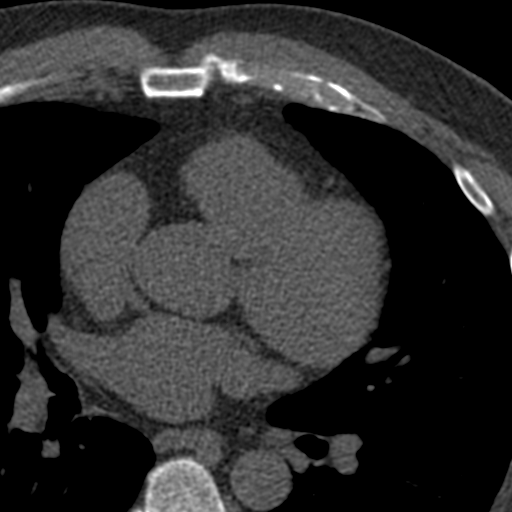
[im 45/53  vessel]
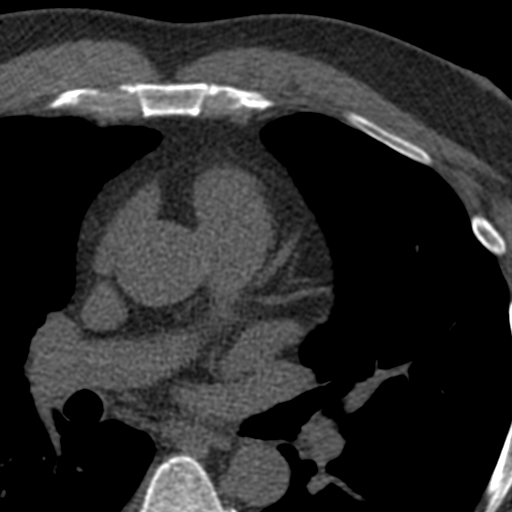

[Series 4: ax st · axial · 0.68mm/px · z∈[+958,+1021]mm · 4 of 35 slices shown, 5 images]
[im 7/35  vessel]
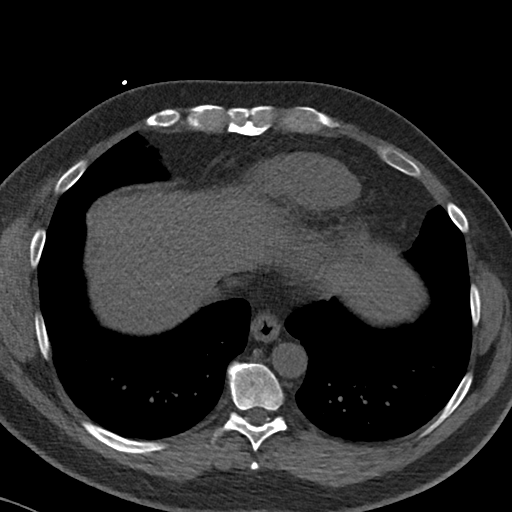
[im 7/35  lung]
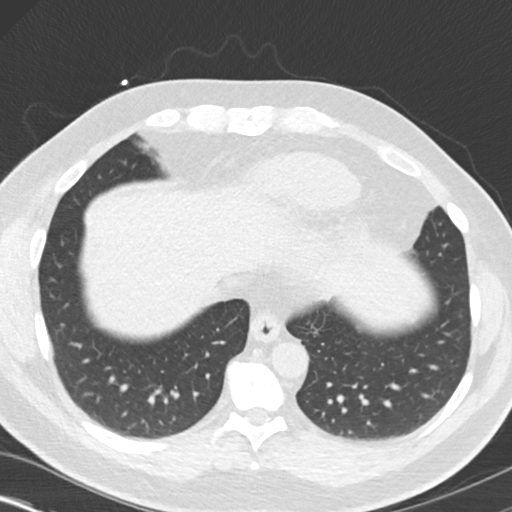
[im 14/35  vessel]
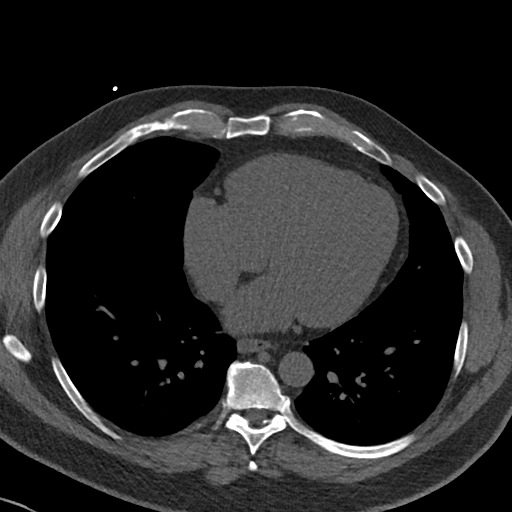
[im 21/35  vessel]
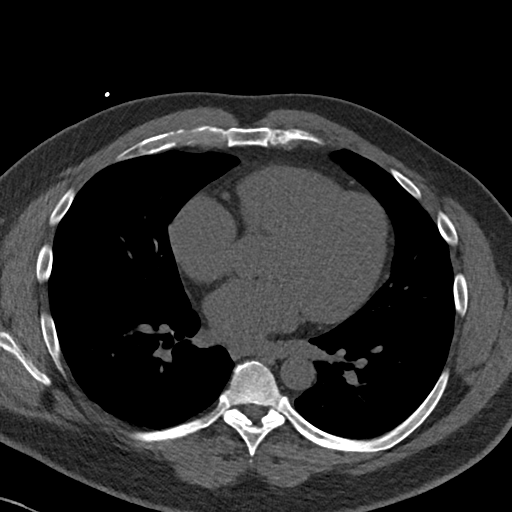
[im 28/35  vessel]
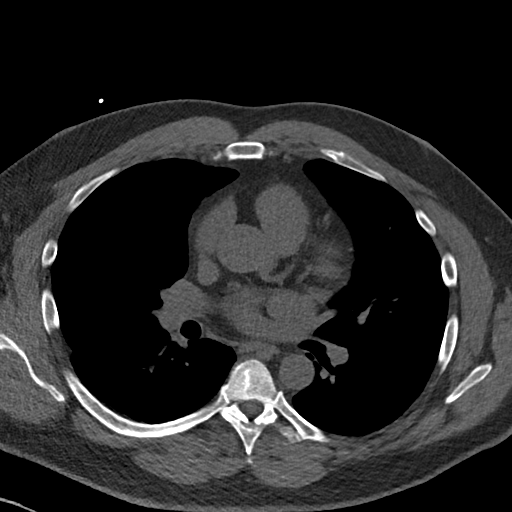

[Series 5: ax lung · axial · 0.68mm/px · z∈[+958,+1021]mm · 4 of 35 slices shown]
[im 7/35  lung]
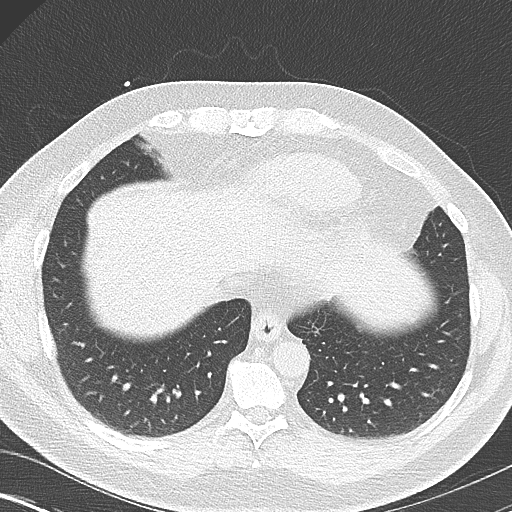
[im 14/35  lung]
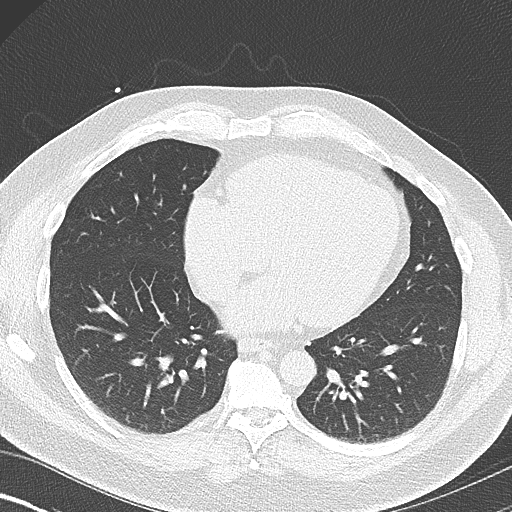
[im 21/35  lung]
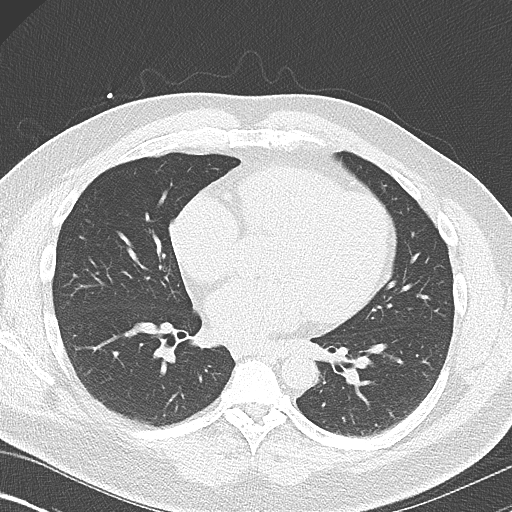
[im 28/35  lung]
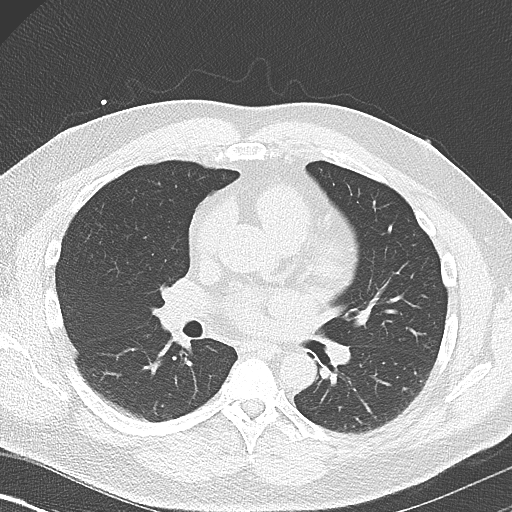

[14 of 20 positions shown; findings below may reference images not displayed]

FINDINGS: Vascular: Normal aortic caliber.

Mediastinum/Nodes: No imaged thoracic adenopathy.

Lungs/Pleura: No pleural fluid.  Clear imaged lungs.

Upper Abdomen: Mild hepatic steatosis. Normal imaged portions of the
spleen, stomach.

Musculoskeletal: No acute osseous abnormality.
IMPRESSION: No acute findings in the imaged extracardiac chest.

Mild hepatic steatosis.
FINDINGS: Non-cardiac: See separate report from [REDACTED].

Ascending Aorta: Normal caliber.

Pericardium: Normal.

Coronary arteries: Normal origins.

Coronary Calcium Score:

Left main: 0

Left anterior descending artery: 0

Left circumflex artery: 0

Right coronary artery: 7

Total: 7

Percentile: 75th for age, sex, and race matched control.
IMPRESSION: 1. Coronary calcium score of 7. This was 75th percentile for age,
gender, and race matched controls.

RECOMMENDATIONS:

The proposed cut-off value of 1,651 GEIST yielded a 93 % sensitivity
and 75 % specificity in grading AS severity in patients with
classical low-flow, low-gradient AS. Proposed different cut-off
values to define severe AS for men and women as 2,065 GEIST and 1,274
GEIST, respectively. The joint European and American recommendations
for the assessment of AS consider the aortic valve calcium score as
a continuum - a very high calcium score suggests severe AS and a low
calcium score suggests severe AS is unlikely.

Yobani, Edgar Otoniel, et al. 2745 ESC/EACTS Guidelines for
the management of valvular heart disease. Eur Heart J
2745;[DATE].



If CAC = 0, it is reasonable to withhold statin therapy and reassess
in 5 to 10 years, as long as higher risk conditions are absent
(diabetes mellitus, family history of premature CHD in first degree
relatives (males <55 years; females <65 years), cigarette smoking,
LDL >=190 mg/dL or other independent risk factors).

If CAC is 1 to 99, it is reasonable to initiate statin therapy for
patients >=55 years of age.

If CAC is >=100 or >=75th percentile, it is reasonable to initiate
statin therapy at any age.

Cardiology referral should be considered for patients with CAC
scores =400 or >=75th percentile.

*1746 AHA/ACC/AACVPR/AAPA/ABC/AUJLA/SARTIKASARI/QUITRAL/[place]/MARKUS/RTOYOTA/FERIENHAUS
Guideline on the Management of Blood Cholesterol: A Report of the
American College of Cardiology/American Heart Association Task Force
on Clinical Practice Guidelines. J Am Coll Cardiol.
7111;73(24):2284-2818.

*** End of Addendum ***
EXAM:
OVER-READ INTERPRETATION  CT CHEST

The following report is an over-read performed by radiologist Dr.
over-read does not include interpretation of cardiac or coronary
anatomy or pathology. The calcium score interpretation by the
cardiologist is attached.
FINDINGS: Vascular: Normal aortic caliber.

Mediastinum/Nodes: No imaged thoracic adenopathy.

Lungs/Pleura: No pleural fluid.  Clear imaged lungs.

Upper Abdomen: Mild hepatic steatosis. Normal imaged portions of the
spleen, stomach.

Musculoskeletal: No acute osseous abnormality.
IMPRESSION: No acute findings in the imaged extracardiac chest.

Mild hepatic steatosis.
